# Patient Record
Sex: Female | Born: 2000 | Hispanic: No | Smoking: Never smoker
Health system: Southern US, Community
[De-identification: ages and names within clinical notes are randomized; demographics above are authoritative.]

## PROBLEM LIST (undated history)

## (undated) ENCOUNTER — Inpatient Hospital Stay (HOSPITAL_COMMUNITY): Payer: Medicaid Other

## (undated) DIAGNOSIS — Z789 Other specified health status: Secondary | ICD-10-CM

## (undated) DIAGNOSIS — O4400 Placenta previa specified as without hemorrhage, unspecified trimester: Secondary | ICD-10-CM

## (undated) HISTORY — PX: NO PAST SURGERIES: SHX2092

---

## 2019-01-26 ENCOUNTER — Other Ambulatory Visit: Payer: Self-pay | Admitting: *Deleted

## 2019-01-26 DIAGNOSIS — Z20822 Contact with and (suspected) exposure to covid-19: Secondary | ICD-10-CM

## 2019-02-01 LAB — NOVEL CORONAVIRUS, NAA: SARS-CoV-2, NAA: NOT DETECTED

## 2020-09-01 DIAGNOSIS — Z20822 Contact with and (suspected) exposure to covid-19: Secondary | ICD-10-CM | POA: Diagnosis not present

## 2020-11-14 ENCOUNTER — Other Ambulatory Visit: Payer: Self-pay

## 2020-11-14 ENCOUNTER — Encounter (HOSPITAL_COMMUNITY): Payer: Self-pay

## 2020-11-14 ENCOUNTER — Inpatient Hospital Stay (HOSPITAL_COMMUNITY)
Admission: AD | Admit: 2020-11-14 | Discharge: 2020-11-15 | Disposition: A | Discharge status: Home / Self Care | Payer: Medicaid Other | Attending: Family Medicine | Admitting: Family Medicine

## 2020-11-14 DIAGNOSIS — O208 Other hemorrhage in early pregnancy: Secondary | ICD-10-CM | POA: Insufficient documentation

## 2020-11-14 DIAGNOSIS — O209 Hemorrhage in early pregnancy, unspecified: Secondary | ICD-10-CM

## 2020-11-14 DIAGNOSIS — Z3A08 8 weeks gestation of pregnancy: Secondary | ICD-10-CM | POA: Diagnosis not present

## 2020-11-14 DIAGNOSIS — O3680X0 Pregnancy with inconclusive fetal viability, not applicable or unspecified: Secondary | ICD-10-CM

## 2020-11-14 DIAGNOSIS — O4691 Antepartum hemorrhage, unspecified, first trimester: Secondary | ICD-10-CM | POA: Diagnosis not present

## 2020-11-14 DIAGNOSIS — Z3A09 9 weeks gestation of pregnancy: Secondary | ICD-10-CM | POA: Insufficient documentation

## 2020-11-14 DIAGNOSIS — O468X1 Other antepartum hemorrhage, first trimester: Secondary | ICD-10-CM

## 2020-11-14 HISTORY — DX: Other specified health status: Z78.9

## 2020-11-14 NOTE — ED Triage Notes (Signed)
Pt states she has had a positive pregnancy test at home, LMP was 09/11/20.  Pt states she has had vaginal bleeding today. Denies pain, nausea, vomiting, or difficulty w/urination. This is first pregnancy.

## 2020-11-14 NOTE — ED Notes (Signed)
Report given to MAU, transport called.

## 2020-11-14 NOTE — ED Triage Notes (Signed)
Emergency Medicine Provider Triage Evaluation Note  Tanya Myers , a 20 y.o. female  was evaluated in triage.  Pt complains of vaginal bleeding began PTA.  Only gone through 1 pad.   Review of Systems  Positive: Vaginal bleeding Negative: Fever, chills, abdominal or pelvic pain   Physical Exam  BP 127/64   Pulse 79   Temp 99 F (37.2 C) (Oral)   Resp 16   SpO2 100%  Gen:   Awake, no distress   HEENT:  Atraumatic  Resp:  Normal effort, lungs clear Cardiac:  Normal rate, rhythm  Abd:   Nondistended, nontender  MSK:   Moves extremities without difficulty  Neuro:  Speech clear   Medical Decision Making  Medically screening exam initiated at 11:18 PM.  Appropriate orders placed.  Tanya Myers was informed that the remainder of the evaluation will be completed by another provider, this initial triage assessment does not replace that evaluation, and the importance of remaining in the ED until their evaluation is complete.  Clinical Impression   Appropriate for transfer to MAU. Spoke to MAU MD who has accepted patient. RN notified.    Kinnie Feil, PA-C 11/14/20 2320

## 2020-11-15 ENCOUNTER — Inpatient Hospital Stay (HOSPITAL_COMMUNITY): Payer: Medicaid Other

## 2020-11-15 ENCOUNTER — Encounter (HOSPITAL_COMMUNITY): Payer: Self-pay

## 2020-11-15 DIAGNOSIS — O209 Hemorrhage in early pregnancy, unspecified: Secondary | ICD-10-CM | POA: Diagnosis not present

## 2020-11-15 DIAGNOSIS — Z3A09 9 weeks gestation of pregnancy: Secondary | ICD-10-CM | POA: Diagnosis not present

## 2020-11-15 DIAGNOSIS — O4691 Antepartum hemorrhage, unspecified, first trimester: Secondary | ICD-10-CM | POA: Diagnosis not present

## 2020-11-15 DIAGNOSIS — Z3A08 8 weeks gestation of pregnancy: Secondary | ICD-10-CM | POA: Diagnosis not present

## 2020-11-15 DIAGNOSIS — O208 Other hemorrhage in early pregnancy: Secondary | ICD-10-CM | POA: Diagnosis not present

## 2020-11-15 LAB — GC/CHLAMYDIA PROBE AMP (~~LOC~~) NOT AT ARMC
Chlamydia: NEGATIVE
Comment: NEGATIVE
Comment: NORMAL
Neisseria Gonorrhea: NEGATIVE

## 2020-11-15 LAB — BASIC METABOLIC PANEL
Anion gap: 10 (ref 5–15)
BUN: 9 mg/dL (ref 6–20)
CO2: 20 mmol/L — ABNORMAL LOW (ref 22–32)
Calcium: 9.6 mg/dL (ref 8.9–10.3)
Chloride: 104 mmol/L (ref 98–111)
Creatinine, Ser: 0.62 mg/dL (ref 0.44–1.00)
GFR, Estimated: >60 mL/min (ref >60)
Glucose, Bld: 88 mg/dL (ref 70–99)
Potassium: 3.5 mmol/L (ref 3.5–5.1)
Sodium: 134 mmol/L — ABNORMAL LOW (ref 135–145)

## 2020-11-15 LAB — CBC
HCT: 35.8 % — ABNORMAL LOW (ref 36.0–46.0)
Hemoglobin: 11.5 g/dL — ABNORMAL LOW (ref 12.0–15.0)
MCH: 25.3 pg — ABNORMAL LOW (ref 26.0–34.0)
MCHC: 32.1 g/dL (ref 30.0–36.0)
MCV: 78.9 fL — ABNORMAL LOW (ref 80.0–100.0)
Platelets: 370 10*3/uL (ref 150–400)
RBC: 4.54 MIL/uL (ref 3.87–5.11)
RDW: 15.8 % — ABNORMAL HIGH (ref 11.5–15.5)
WBC: 11.4 10*3/uL — ABNORMAL HIGH (ref 4.0–10.5)
nRBC: 0 % (ref 0.0–0.2)

## 2020-11-15 LAB — WET PREP, GENITAL
Sperm: NONE SEEN
Trich, Wet Prep: NONE SEEN
WBC, Wet Prep HPF POC: NONE SEEN
Yeast Wet Prep HPF POC: NONE SEEN

## 2020-11-15 LAB — POCT PREGNANCY, URINE: Preg Test, Ur: POSITIVE — AB

## 2020-11-15 NOTE — MAU Provider Note (Signed)
Chief Complaint: Vaginal Bleeding   Event Date/Time   First Provider Initiated Contact with Patient 11/15/20 0120        SUBJECTIVE HPI: Tanya Myers is a 20 y.o. G1P0 at [redacted]w[redacted]d by LMP who presents to maternity admissions reporting vaginal bleeding which started yesterday.  Was heavy at first and now is light.  No pain.   Plans care with [facility]. . She denies vaginal bleeding, vaginal itching/burning, urinary symptoms, h/a, dizziness, n/v, or fever/chills.    Vaginal Bleeding The patient's primary symptoms include vaginal bleeding. The patient's pertinent negatives include no genital itching, genital lesions, genital odor or pelvic pain. This is a new problem. The current episode started yesterday. The problem occurs intermittently. The patient is experiencing no pain. She is pregnant. Pertinent negatives include no abdominal pain, back pain, chills, fever, frequency, headaches, nausea or vomiting. The vaginal discharge was bloody. The vaginal bleeding is lighter than menses. She has not been passing clots. She has not been passing tissue. Nothing aggravates the symptoms. She has tried nothing for the symptoms.   RN Note: Patient had a positive home pregnancy test about 3 weeks ago. Began bleeding yesterday around 10pm when she was on the toilet. Denies abdominal cramping.  LMP: 09/11/2020  Past Medical History:  Diagnosis Date  . Medical history non-contributory    Past Surgical History:  Procedure Laterality Date  . NO PAST SURGERIES     Social History   Socioeconomic History  . Marital status: Soil scientist    Spouse name: Not on file  . Number of children: Not on file  . Years of education: Not on file  . Highest education level: Not on file  Occupational History  . Not on file  Tobacco Use  . Smoking status: Never Smoker  . Smokeless tobacco: Never Used  Vaping Use  . Vaping Use: Never used  Substance and Sexual Activity  . Alcohol use: Never  . Drug use: Never  . Sexual  activity: Not on file  Other Topics Concern  . Not on file  Social History Narrative  . Not on file   Social Determinants of Health   Financial Resource Strain: Not on file  Food Insecurity: Not on file  Transportation Needs: Not on file  Physical Activity: Not on file  Stress: Not on file  Social Connections: Not on file  Intimate Partner Violence: Not on file   No current facility-administered medications on file prior to encounter.   No current outpatient medications on file prior to encounter.   No Known Allergies  I have reviewed patient's Past Medical Hx, Surgical Hx, Family Hx, Social Hx, medications and allergies.   ROS:  Review of Systems  Constitutional: Negative for chills and fever.  Gastrointestinal: Negative for abdominal pain, nausea and vomiting.  Genitourinary: Positive for vaginal bleeding. Negative for frequency and pelvic pain.  Musculoskeletal: Negative for back pain.  Neurological: Negative for headaches.   Review of Systems  Other systems negative   Physical Exam  Physical Exam Patient Vitals for the past 24 hrs:  BP Temp Temp src Pulse Resp SpO2 Height Weight  11/15/20 0015 (!) 105/45 -- -- 75 16 100 % 5\' 4"  (1.626 m) 83.8 kg  11/14/20 2312 127/64 99 F (37.2 C) Oral 79 16 100 % -- --   Constitutional: Well-developed, well-nourished female in no acute distress.  Cardiovascular: normal rate Respiratory: normal effort GI: Abd soft, non-tender. No guarding or rebound MS: Extremities nontender, no edema, normal ROM Neurologic: Alert  and oriented x 4.  GU: Neg CVAT.  PELVIC EXAM: Cervix pink, visually closed, without lesion, scant pink discharge, vaginal walls and external genitalia normal   LAB RESULTS Results for orders placed or performed during the hospital encounter of 11/14/20 (from the past 24 hour(s))  CBC     Status: Abnormal   Collection Time: 11/15/20 12:25 AM  Result Value Ref Range   WBC 11.4 (H) 4.0 - 10.5 K/uL   RBC 4.54  3.87 - 5.11 MIL/uL   Hemoglobin 11.5 (L) 12.0 - 15.0 g/dL   HCT 35.8 (L) 36.0 - 46.0 %   MCV 78.9 (L) 80.0 - 100.0 fL   MCH 25.3 (L) 26.0 - 34.0 pg   MCHC 32.1 30.0 - 36.0 g/dL   RDW 15.8 (H) 11.5 - 15.5 %   Platelets 370 150 - 400 K/uL   nRBC 0.0 0.0 - 0.2 %  Basic metabolic panel     Status: Abnormal   Collection Time: 11/15/20 12:25 AM  Result Value Ref Range   Sodium 134 (L) 135 - 145 mmol/L   Potassium 3.5 3.5 - 5.1 mmol/L   Chloride 104 98 - 111 mmol/L   CO2 20 (L) 22 - 32 mmol/L   Glucose, Bld 88 70 - 99 mg/dL   BUN 9 6 - 20 mg/dL   Creatinine, Ser 0.62 0.44 - 1.00 mg/dL   Calcium 9.6 8.9 - 10.3 mg/dL   GFR, Estimated >60 >60 mL/min   Anion gap 10 5 - 15  Pregnancy, urine POC     Status: Abnormal   Collection Time: 11/15/20 12:27 AM  Result Value Ref Range   Preg Test, Ur POSITIVE (A) NEGATIVE  Wet prep, genital     Status: Abnormal   Collection Time: 11/15/20  1:28 AM   Specimen: Vaginal  Result Value Ref Range   Yeast Wet Prep HPF POC NONE SEEN NONE SEEN   Trich, Wet Prep NONE SEEN NONE SEEN   Clue Cells Wet Prep HPF POC PRESENT (A) NONE SEEN   WBC, Wet Prep HPF POC NONE SEEN NONE SEEN   Sperm NONE SEEN     IMAGING US OB Comp Less 14 Wks  Result Date: 11/15/2020 CLINICAL DATA:  Vaginal bleeding today. Estimated gestational age by LMP is 9 weeks 2 days. Quantitative beta HCG is not provided. EXAM: OBSTETRIC <14 WK [place] AND TRANSVAGINAL OB US TECHNIQUE: Both transabdominal and transvaginal ultrasound examinations were performed for complete evaluation of the gestation as well as the maternal uterus, adnexal regions, and pelvic cul-de-sac. Transvaginal technique was performed to assess early pregnancy. COMPARISON:  None. FINDINGS: Intrauterine gestational sac: A single intrauterine gestational sac is identified. Yolk sac:  The yolk sac is not visualized. Embryo:  Fetal pole is identified. Cardiac Activity: Fetal cardiac activity is observed. Heart Rate: 174 bpm CRL: 24.1  mm   9 w   1 d                  [place] EDC: 06/19/2021 Subchorionic hemorrhage: A small subchorionic hemorrhage is identified inferiorly, measuring 2.9 x 1.1 cm. Maternal uterus/adnexae: Uterus appears retroverted. No myometrial mass lesions. Small nabothian cysts in the cervix. Ovaries are not visualized but no abnormal adnexal masses are seen. No abnormal pelvic fluid collections. IMPRESSION: Single intrauterine pregnancy. Estimated gestational age by crown-rump length is 9 weeks 1 day. Small subchorionic hemorrhage. Electronically Signed   By: Lucienne Capers M.D.   On: 11/15/2020 02:18   US OB Transvaginal  Result  Date: 11/15/2020 CLINICAL DATA:  Vaginal bleeding today. Estimated gestational age by LMP is 9 weeks 2 days. Quantitative beta HCG is not provided. EXAM: OBSTETRIC <14 WK [place] AND TRANSVAGINAL OB US TECHNIQUE: Both transabdominal and transvaginal ultrasound examinations were performed for complete evaluation of the gestation as well as the maternal uterus, adnexal regions, and pelvic cul-de-sac. Transvaginal technique was performed to assess early pregnancy. COMPARISON:  None. FINDINGS: Intrauterine gestational sac: A single intrauterine gestational sac is identified. Yolk sac:  The yolk sac is not visualized. Embryo:  Fetal pole is identified. Cardiac Activity: Fetal cardiac activity is observed. Heart Rate: 174 bpm CRL: 24.1 mm   9 w   1 d                  [place] EDC: 06/19/2021 Subchorionic hemorrhage: A small subchorionic hemorrhage is identified inferiorly, measuring 2.9 x 1.1 cm. Maternal uterus/adnexae: Uterus appears retroverted. No myometrial mass lesions. Small nabothian cysts in the cervix. Ovaries are not visualized but no abnormal adnexal masses are seen. No abnormal pelvic fluid collections. IMPRESSION: Single intrauterine pregnancy. Estimated gestational age by crown-rump length is 9 weeks 1 day. Small subchorionic hemorrhage. Electronically Signed   By: Lucienne Capers M.D.   On: 11/15/2020  02:18   MAU Management/MDM: Ordered usual first trimester r/o ectopic labs.   Pelvic exam and cultures done Will check baseline Ultrasound to rule out ectopic.  This bleeding can represent a normal pregnancy with bleeding, spontaneous abortion or even an ectopic which can be life-threatening.  The process as listed above helps to determine which of these is present.  Reviewed findings with patient.  Discussed small subchorionic hemorrhage.  My have spotting for week or so until resolves.  Patient has new OB appt scheduled already  ASSESSMENT Single IUP at [redacted]w[redacted]d Bleeding in first trimester Small Subchorionic hemorrhage  PLAN Discharge home Encouraged to start prenatal care Pelvic rest Pt stable at time of discharge. Encouraged to return if she develops worsening of symptoms, increase in pain, fever, or other concerning symptoms.    Hansel Feinstein CNM, MSN Certified Nurse-Midwife 11/15/2020  1:20 AM

## 2020-11-15 NOTE — Discharge Instructions (Signed)
Subchorionic Hematoma  A hematoma is a collection of blood outside of the blood vessels. A subchorionic hematoma is a collection of blood between the outer wall of the embryo (chorion) and the inner wall of the uterus. This condition can cause vaginal bleeding. Early small hematomas usually shrink on their own and do not affect your baby or pregnancy. When bleeding starts later in pregnancy, or if the hematoma is larger or occurs in older pregnant women, the condition may be more serious. Larger hematomas increase the chances of miscarriage. This condition also increases the risk of:  Premature separation of the placenta from the uterus.  Premature (preterm) labor.  Stillbirth. What are the causes? The exact cause of this condition is not known. It occurs when blood is trapped between the placenta and the uterine wall because the placenta has separated from the original site of implantation. What increases the risk? You are more likely to develop this condition if:  You were treated with fertility medicines.  You became pregnant through in vitro fertilization (IVF). What are the signs or symptoms? Symptoms of this condition include:  Vaginal spotting or bleeding.  Abdominal pain. This is rare. Sometimes you may have no symptoms and the bleeding may only be seen when ultrasound images are taken (transvaginal ultrasound). How is this diagnosed? This condition is diagnosed based on a physical exam. This includes a pelvic exam. You may also have other tests, including:  Blood tests.  Urine tests.  Ultrasound of the abdomen. How is this treated? Treatment for this condition can vary. Treatment may include:  Watchful waiting. You will be monitored closely for any changes in bleeding.  Medicines.  Activity restriction. This may be needed until the bleeding stops.  A medicine called Rh immunoglobulin. This is given if you have an Rh-negative blood type. It prevents Rh  sensitization. Follow these instructions at home:  Stay on bed rest if told to do so by your health care provider.  Do not lift anything that is heavier than 10 lb (4.5 kg), or the limit that you are told by your health care provider.  Track and write down the number of pads you use each day and how soaked (saturated) they are.  Do not use tampons.  Keep all follow-up visits. This is important. Your health care provider may ask you to have follow-up blood tests or ultrasound tests or both. Contact a health care provider if:  You have any vaginal bleeding.  You have a fever. Get help right away if:  You have severe cramps in your stomach, back, abdomen, or pelvis.  You pass large clots or tissue. Save any tissue for your health care provider to look at.  You faint.  You become light-headed or weak. Summary  A subchorionic hematoma is a collection of blood between the outer wall of the embryo (chorion) and the inner wall of the uterus.  This condition can cause vaginal bleeding.  Sometimes you may have no symptoms and the bleeding may only be seen when ultrasound images are taken.  Treatment may include watchful waiting, medicines, or activity restriction.  Keep all follow-up visits. Get help right away if you have severe cramps or heavy vaginal bleeding. This information is not intended to replace advice given to you by your health care provider. Make sure you discuss any questions you have with your health care provider. Document Revised: 04/03/2020 Document Reviewed: 04/03/2020 Elsevier Patient Education  2021 [employer].

## 2020-11-15 NOTE — MAU Note (Signed)
Patient had a positive home pregnancy test about 3 weeks ago. Began bleeding yesterday around 10pm when she was on the toilet. Denies abdominal cramping.  LMP: 09/11/2020

## 2020-12-12 DIAGNOSIS — N925 Other specified irregular menstruation: Secondary | ICD-10-CM | POA: Diagnosis not present

## 2020-12-12 DIAGNOSIS — N912 Amenorrhea, unspecified: Secondary | ICD-10-CM | POA: Diagnosis not present

## 2020-12-12 DIAGNOSIS — Z113 Encounter for screening for infections with a predominantly sexual mode of transmission: Secondary | ICD-10-CM | POA: Diagnosis not present

## 2020-12-12 DIAGNOSIS — O3680X9 Pregnancy with inconclusive fetal viability, other fetus: Secondary | ICD-10-CM | POA: Diagnosis not present

## 2020-12-12 DIAGNOSIS — Z3490 Encounter for supervision of normal pregnancy, unspecified, unspecified trimester: Secondary | ICD-10-CM | POA: Diagnosis not present

## 2020-12-12 DIAGNOSIS — Z3A13 13 weeks gestation of pregnancy: Secondary | ICD-10-CM | POA: Diagnosis not present

## 2020-12-12 LAB — HEPATITIS C ANTIBODY: HCV Ab: NEGATIVE

## 2020-12-12 LAB — OB RESULTS CONSOLE RUBELLA ANTIBODY, IGM: Rubella: IMMUNE

## 2021-02-07 DIAGNOSIS — Z3402 Encounter for supervision of normal first pregnancy, second trimester: Secondary | ICD-10-CM | POA: Diagnosis not present

## 2021-02-07 DIAGNOSIS — Z363 Encounter for antenatal screening for malformations: Secondary | ICD-10-CM | POA: Diagnosis not present

## 2021-02-07 DIAGNOSIS — O365999 Maternal care for other known or suspected poor fetal growth, unspecified trimester, other fetus: Secondary | ICD-10-CM | POA: Diagnosis not present

## 2021-02-07 DIAGNOSIS — Z3A21 21 weeks gestation of pregnancy: Secondary | ICD-10-CM | POA: Diagnosis not present

## 2021-02-07 DIAGNOSIS — Z3686 Encounter for antenatal screening for cervical length: Secondary | ICD-10-CM | POA: Diagnosis not present

## 2021-03-08 DIAGNOSIS — Z3A25 25 weeks gestation of pregnancy: Secondary | ICD-10-CM | POA: Diagnosis not present

## 2021-03-08 DIAGNOSIS — Z362 Encounter for other antenatal screening follow-up: Secondary | ICD-10-CM | POA: Diagnosis not present

## 2021-03-08 DIAGNOSIS — Z331 Pregnant state, incidental: Secondary | ICD-10-CM | POA: Diagnosis not present

## 2021-03-08 DIAGNOSIS — O4402 Placenta previa specified as without hemorrhage, second trimester: Secondary | ICD-10-CM | POA: Diagnosis not present

## 2021-03-08 DIAGNOSIS — E559 Vitamin D deficiency, unspecified: Secondary | ICD-10-CM | POA: Diagnosis not present

## 2021-03-08 DIAGNOSIS — Z3490 Encounter for supervision of normal pregnancy, unspecified, unspecified trimester: Secondary | ICD-10-CM | POA: Diagnosis not present

## 2021-03-21 DIAGNOSIS — Z3492 Encounter for supervision of normal pregnancy, unspecified, second trimester: Secondary | ICD-10-CM | POA: Diagnosis not present

## 2021-03-21 LAB — OB RESULTS CONSOLE HIV ANTIBODY (ROUTINE TESTING): HIV: NONREACTIVE

## 2021-04-06 DIAGNOSIS — Z3686 Encounter for antenatal screening for cervical length: Secondary | ICD-10-CM | POA: Diagnosis not present

## 2021-04-06 DIAGNOSIS — O4403 Placenta previa specified as without hemorrhage, third trimester: Secondary | ICD-10-CM | POA: Diagnosis not present

## 2021-04-06 DIAGNOSIS — Z23 Encounter for immunization: Secondary | ICD-10-CM | POA: Diagnosis not present

## 2021-04-06 DIAGNOSIS — O99019 Anemia complicating pregnancy, unspecified trimester: Secondary | ICD-10-CM | POA: Diagnosis not present

## 2021-04-06 DIAGNOSIS — O365999 Maternal care for other known or suspected poor fetal growth, unspecified trimester, other fetus: Secondary | ICD-10-CM | POA: Diagnosis not present

## 2021-04-06 DIAGNOSIS — E559 Vitamin D deficiency, unspecified: Secondary | ICD-10-CM | POA: Diagnosis not present

## 2021-04-06 DIAGNOSIS — Z3A29 29 weeks gestation of pregnancy: Secondary | ICD-10-CM | POA: Diagnosis not present

## 2021-04-06 DIAGNOSIS — Z3493 Encounter for supervision of normal pregnancy, unspecified, third trimester: Secondary | ICD-10-CM | POA: Diagnosis not present

## 2021-04-06 DIAGNOSIS — Z331 Pregnant state, incidental: Secondary | ICD-10-CM | POA: Diagnosis not present

## 2021-05-04 DIAGNOSIS — O365999 Maternal care for other known or suspected poor fetal growth, unspecified trimester, other fetus: Secondary | ICD-10-CM | POA: Diagnosis not present

## 2021-05-04 DIAGNOSIS — O4403 Placenta previa specified as without hemorrhage, third trimester: Secondary | ICD-10-CM | POA: Diagnosis not present

## 2021-05-04 DIAGNOSIS — Z3A33 33 weeks gestation of pregnancy: Secondary | ICD-10-CM | POA: Diagnosis not present

## 2021-05-18 DIAGNOSIS — Z3402 Encounter for supervision of normal first pregnancy, second trimester: Secondary | ICD-10-CM | POA: Diagnosis not present

## 2021-05-22 ENCOUNTER — Other Ambulatory Visit: Payer: Self-pay | Admitting: Obstetrics and Gynecology

## 2021-05-23 DIAGNOSIS — Z3A36 36 weeks gestation of pregnancy: Secondary | ICD-10-CM | POA: Diagnosis not present

## 2021-05-23 DIAGNOSIS — O99019 Anemia complicating pregnancy, unspecified trimester: Secondary | ICD-10-CM | POA: Diagnosis not present

## 2021-05-23 DIAGNOSIS — Z331 Pregnant state, incidental: Secondary | ICD-10-CM | POA: Diagnosis not present

## 2021-05-23 DIAGNOSIS — E559 Vitamin D deficiency, unspecified: Secondary | ICD-10-CM | POA: Diagnosis not present

## 2021-05-23 DIAGNOSIS — O365999 Maternal care for other known or suspected poor fetal growth, unspecified trimester, other fetus: Secondary | ICD-10-CM | POA: Diagnosis not present

## 2021-05-23 DIAGNOSIS — O4403 Placenta previa specified as without hemorrhage, third trimester: Secondary | ICD-10-CM | POA: Diagnosis not present

## 2021-05-23 NOTE — Patient Instructions (Signed)
Rennee Coyne  05/23/2021   Your procedure is scheduled on:  05/29/2021  Arrive at 1:15 PM at Entrance C on [address] at [facility]  and [address]. You are invited to use the FREE valet parking or use the Visitor's parking deck.  Pick up the phone at the desk and dial [phone].  Call this number if you have problems the morning of surgery: [phone]  Remember:   Do not eat food:(After Midnight) Desps de medianoche.  Do not drink clear liquids: (After Midnight) Desps de medianoche.  Take these medicines the morning of surgery with A SIP OF WATER:  none   Do not wear jewelry, make-up or nail polish.  Do not wear lotions, powders, or perfumes. Do not wear deodorant.  Do not shave 48 hours prior to surgery.  Do not bring valuables to the hospital.  [facility] is not   responsible for any belongings or valuables brought to the hospital.  Contacts, dentures or bridgework may not be worn into surgery.  Leave suitcase in the car. After surgery it may be brought to your room.  For patients admitted to the hospital, checkout time is 11:00 AM the day of              discharge.      Please read over the following fact sheets that you were given:     Preparing for Surgery

## 2021-05-24 ENCOUNTER — Telehealth (HOSPITAL_COMMUNITY): Payer: Self-pay | Admitting: *Deleted

## 2021-05-24 ENCOUNTER — Encounter (HOSPITAL_COMMUNITY): Payer: Self-pay

## 2021-05-24 NOTE — Telephone Encounter (Signed)
Preadmission screen  

## 2021-05-28 ENCOUNTER — Other Ambulatory Visit: Payer: Self-pay

## 2021-05-28 ENCOUNTER — Encounter (HOSPITAL_COMMUNITY)
Admission: RE | Admit: 2021-05-28 | Discharge: 2021-05-28 | Disposition: A | Discharge status: Home / Self Care | Payer: Medicaid Other | Source: Ambulatory Visit | Attending: Obstetrics & Gynecology | Admitting: Obstetrics & Gynecology

## 2021-05-28 ENCOUNTER — Other Ambulatory Visit: Payer: Self-pay | Admitting: Obstetrics & Gynecology

## 2021-05-28 DIAGNOSIS — Z01812 Encounter for preprocedural laboratory examination: Secondary | ICD-10-CM | POA: Insufficient documentation

## 2021-05-28 DIAGNOSIS — Z20822 Contact with and (suspected) exposure to covid-19: Secondary | ICD-10-CM | POA: Insufficient documentation

## 2021-05-28 HISTORY — DX: Complete placenta previa nos or without hemorrhage, unspecified trimester: O44.00

## 2021-05-28 LAB — CBC
HCT: 40.1 % (ref 36.0–46.0)
Hemoglobin: 13.4 g/dL (ref 12.0–15.0)
MCH: 28.6 pg (ref 26.0–34.0)
MCHC: 33.4 g/dL (ref 30.0–36.0)
MCV: 85.7 fL (ref 80.0–100.0)
Platelets: 286 10*3/uL (ref 150–400)
RBC: 4.68 MIL/uL (ref 3.87–5.11)
RDW: 20.3 % — ABNORMAL HIGH (ref 11.5–15.5)
WBC: 10.6 10*3/uL — ABNORMAL HIGH (ref 4.0–10.5)
nRBC: 0 % (ref 0.0–0.2)

## 2021-05-28 LAB — SARS CORONAVIRUS 2 (TAT 6-24 HRS): SARS Coronavirus 2: NEGATIVE

## 2021-05-28 LAB — TYPE AND SCREEN
ABO/RH(D): A POS
Antibody Screen: NEGATIVE

## 2021-05-28 NOTE — Anesthesia Preprocedure Evaluation (Addendum)
Anesthesia Evaluation  Patient identified by MRN, date of birth, ID band Patient awake    Reviewed: Allergy & Precautions, NPO status , Patient's Chart, lab work & pertinent test results  History of Anesthesia Complications Negative for: history of anesthetic complications  Airway Mallampati: II  TM Distance: >3 FB Neck ROM: Full    Dental no notable dental hx. (+) Dental Advisory Given Braces:   Pulmonary neg pulmonary ROS,    Pulmonary exam normal        Cardiovascular negative cardio ROS Normal cardiovascular exam     Neuro/Psych negative neurological ROS     GI/Hepatic negative GI ROS, Neg liver ROS,   Endo/Other  negative endocrine ROS  Renal/GU negative Renal ROS     Musculoskeletal negative musculoskeletal ROS (+)   Abdominal   Peds  Hematology negative hematology ROS (+)   Anesthesia Other Findings   Reproductive/Obstetrics (+) Pregnancy                            Anesthesia Physical Anesthesia Plan  ASA: 2  Anesthesia Plan: Spinal   Post-op Pain Management:    Induction:   PONV Risk Score and Plan: 3 and Ondansetron, Dexamethasone and Scopolamine patch - Pre-op  Airway Management Planned: Natural Airway  Additional Equipment:   Intra-op Plan:   Post-operative Plan:   Informed Consent: I have reviewed the patients History and Physical, chart, labs and discussed the procedure including the risks, benefits and alternatives for the proposed anesthesia with the patient or authorized representative who has indicated his/her understanding and acceptance.     Dental advisory given  Plan Discussed with: Anesthesiologist and CRNA  Anesthesia Plan Comments:        Anesthesia Quick Evaluation

## 2021-05-29 ENCOUNTER — Inpatient Hospital Stay (HOSPITAL_COMMUNITY): Payer: Medicaid Other | Admitting: Anesthesiology

## 2021-05-29 ENCOUNTER — Inpatient Hospital Stay (HOSPITAL_COMMUNITY)
Admission: RE | Admit: 2021-05-29 | Discharge: 2021-06-01 | DRG: 787 | Disposition: A | Discharge status: Home / Self Care | Payer: Medicaid Other | Attending: Obstetrics & Gynecology | Admitting: Obstetrics & Gynecology

## 2021-05-29 ENCOUNTER — Encounter (HOSPITAL_COMMUNITY): Payer: Self-pay | Admitting: Obstetrics & Gynecology

## 2021-05-29 ENCOUNTER — Other Ambulatory Visit: Payer: Self-pay

## 2021-05-29 ENCOUNTER — Encounter (HOSPITAL_COMMUNITY)
Admission: RE | Disposition: A | Discharge status: Home / Self Care | Payer: Self-pay | Source: Home / Self Care | Attending: Obstetrics & Gynecology

## 2021-05-29 DIAGNOSIS — O34211 Maternal care for low transverse scar from previous cesarean delivery: Secondary | ICD-10-CM | POA: Diagnosis not present

## 2021-05-29 DIAGNOSIS — O43813 Placental infarction, third trimester: Secondary | ICD-10-CM | POA: Diagnosis not present

## 2021-05-29 DIAGNOSIS — O3413 Maternal care for benign tumor of corpus uteri, third trimester: Secondary | ICD-10-CM | POA: Diagnosis not present

## 2021-05-29 DIAGNOSIS — Z3A37 37 weeks gestation of pregnancy: Secondary | ICD-10-CM | POA: Diagnosis not present

## 2021-05-29 DIAGNOSIS — Z98891 History of uterine scar from previous surgery: Secondary | ICD-10-CM

## 2021-05-29 DIAGNOSIS — O322XX0 Maternal care for transverse and oblique lie, not applicable or unspecified: Secondary | ICD-10-CM | POA: Diagnosis not present

## 2021-05-29 DIAGNOSIS — D259 Leiomyoma of uterus, unspecified: Secondary | ICD-10-CM | POA: Diagnosis not present

## 2021-05-29 DIAGNOSIS — O4400 Placenta previa specified as without hemorrhage, unspecified trimester: Secondary | ICD-10-CM | POA: Diagnosis present

## 2021-05-29 DIAGNOSIS — D267 Other benign neoplasm of other parts of uterus: Secondary | ICD-10-CM | POA: Diagnosis not present

## 2021-05-29 DIAGNOSIS — O365930 Maternal care for other known or suspected poor fetal growth, third trimester, not applicable or unspecified: Secondary | ICD-10-CM | POA: Diagnosis not present

## 2021-05-29 DIAGNOSIS — O4403 Placenta previa specified as without hemorrhage, third trimester: Secondary | ICD-10-CM | POA: Diagnosis not present

## 2021-05-29 LAB — RPR: RPR Ser Ql: NONREACTIVE

## 2021-05-29 SURGERY — Surgical Case
Anesthesia: Spinal

## 2021-05-29 MED ORDER — IBUPROFEN 600 MG PO TABS
600.0000 mg | ORAL_TABLET | Freq: Four times a day (QID) | ORAL | Status: DC
  Administered 2021-05-30 – 2021-06-01 (×7): 600 mg via ORAL
  Filled 2021-05-29 (×8): qty 1

## 2021-05-29 MED ORDER — PHENYLEPHRINE HCL-NACL 20-0.9 MG/250ML-% IV SOLN
INTRAVENOUS | Status: DC | PRN
  Administered 2021-05-29: 60 ug/min via INTRAVENOUS

## 2021-05-29 MED ORDER — CEFAZOLIN SODIUM-DEXTROSE 2-4 GM/100ML-% IV SOLN
2.0000 g | INTRAVENOUS | Status: AC
  Administered 2021-05-29: 2 g via INTRAVENOUS

## 2021-05-29 MED ORDER — CELECOXIB 200 MG PO CAPS
ORAL_CAPSULE | ORAL | Status: AC
  Filled 2021-05-29: qty 1

## 2021-05-29 MED ORDER — NALBUPHINE HCL 10 MG/ML IJ SOLN: 5.0000 mg | Freq: Once | INTRAMUSCULAR | Status: DC | PRN

## 2021-05-29 MED ORDER — PRENATAL MULTIVITAMIN CH
1.0000 | ORAL_TABLET | Freq: Every day | ORAL | Status: DC
  Administered 2021-05-30 – 2021-06-01 (×3): 1 via ORAL
  Filled 2021-05-29 (×3): qty 1

## 2021-05-29 MED ORDER — ACETAMINOPHEN 500 MG PO TABS
1000.0000 mg | ORAL_TABLET | Freq: Once | ORAL | Status: AC
  Administered 2021-05-29: 1000 mg via ORAL

## 2021-05-29 MED ORDER — ONDANSETRON HCL 4 MG/2ML IJ SOLN
INTRAMUSCULAR | Status: AC
  Filled 2021-05-29: qty 2

## 2021-05-29 MED ORDER — FENTANYL CITRATE (PF) 100 MCG/2ML IJ SOLN
INTRAMUSCULAR | Status: DC | PRN
  Administered 2021-05-29: 15 ug via INTRATHECAL

## 2021-05-29 MED ORDER — FENTANYL CITRATE (PF) 100 MCG/2ML IJ SOLN: 25.0000 ug | INTRAMUSCULAR | Status: DC | PRN

## 2021-05-29 MED ORDER — ACETAMINOPHEN 500 MG PO TABS
1000.0000 mg | ORAL_TABLET | Freq: Four times a day (QID) | ORAL | Status: DC
  Administered 2021-05-29 – 2021-06-01 (×9): 1000 mg via ORAL
  Filled 2021-05-29 (×10): qty 2

## 2021-05-29 MED ORDER — MEPERIDINE HCL 25 MG/ML IJ SOLN: 6.2500 mg | INTRAMUSCULAR | Status: DC | PRN

## 2021-05-29 MED ORDER — SIMETHICONE 80 MG PO CHEW
80.0000 mg | CHEWABLE_TABLET | Freq: Three times a day (TID) | ORAL | Status: DC
  Administered 2021-05-30 – 2021-06-01 (×7): 80 mg via ORAL
  Filled 2021-05-29 (×7): qty 1

## 2021-05-29 MED ORDER — DIPHENHYDRAMINE HCL 50 MG/ML IJ SOLN: 12.5000 mg | INTRAMUSCULAR | Status: DC | PRN

## 2021-05-29 MED ORDER — DIPHENHYDRAMINE HCL 25 MG PO CAPS: 25.0000 mg | ORAL_CAPSULE | Freq: Four times a day (QID) | ORAL | Status: DC | PRN

## 2021-05-29 MED ORDER — NALOXONE HCL 0.4 MG/ML IJ SOLN: 0.4000 mg | INTRAMUSCULAR | Status: DC | PRN

## 2021-05-29 MED ORDER — OXYTOCIN-SODIUM CHLORIDE 30-0.9 UT/500ML-% IV SOLN
INTRAVENOUS | Status: AC
  Filled 2021-05-29: qty 500

## 2021-05-29 MED ORDER — OXYTOCIN-SODIUM CHLORIDE 30-0.9 UT/500ML-% IV SOLN
INTRAVENOUS | Status: DC | PRN
  Administered 2021-05-29: 250 mL/h via INTRAVENOUS

## 2021-05-29 MED ORDER — FENTANYL CITRATE (PF) 100 MCG/2ML IJ SOLN
INTRAMUSCULAR | Status: AC
  Filled 2021-05-29: qty 2

## 2021-05-29 MED ORDER — WITCH HAZEL-GLYCERIN EX PADS: 1.0000 "application " | MEDICATED_PAD | CUTANEOUS | Status: DC | PRN

## 2021-05-29 MED ORDER — NALBUPHINE HCL 10 MG/ML IJ SOLN: 5.0000 mg | INTRAMUSCULAR | Status: DC | PRN

## 2021-05-29 MED ORDER — CELECOXIB 200 MG PO CAPS
200.0000 mg | ORAL_CAPSULE | Freq: Once | ORAL | Status: AC
  Administered 2021-05-29: 200 mg via ORAL

## 2021-05-29 MED ORDER — SODIUM CHLORIDE 0.9 % IR SOLN
Status: DC | PRN
  Administered 2021-05-29: 500 mL

## 2021-05-29 MED ORDER — SCOPOLAMINE 1 MG/3DAYS TD PT72
1.0000 | MEDICATED_PATCH | Freq: Once | TRANSDERMAL | Status: AC
  Administered 2021-05-29: 1.5 mg via TRANSDERMAL

## 2021-05-29 MED ORDER — MORPHINE SULFATE (PF) 0.5 MG/ML IJ SOLN
INTRAMUSCULAR | Status: AC
  Filled 2021-05-29: qty 10

## 2021-05-29 MED ORDER — SODIUM CHLORIDE 0.9% FLUSH: 3.0000 mL | INTRAVENOUS | Status: DC | PRN

## 2021-05-29 MED ORDER — PHENYLEPHRINE HCL-NACL 20-0.9 MG/250ML-% IV SOLN
INTRAVENOUS | Status: AC
  Filled 2021-05-29: qty 250

## 2021-05-29 MED ORDER — MORPHINE SULFATE (PF) 0.5 MG/ML IJ SOLN
INTRAMUSCULAR | Status: DC | PRN
  Administered 2021-05-29: 150 ug via INTRATHECAL

## 2021-05-29 MED ORDER — OXYTOCIN-SODIUM CHLORIDE 30-0.9 UT/500ML-% IV SOLN
2.5000 [IU]/h | INTRAVENOUS | Status: AC
  Administered 2021-05-29: 2.5 [IU]/h via INTRAVENOUS
  Filled 2021-05-29: qty 500

## 2021-05-29 MED ORDER — PROMETHAZINE HCL 25 MG/ML IJ SOLN: 6.2500 mg | INTRAMUSCULAR | Status: DC | PRN

## 2021-05-29 MED ORDER — STERILE WATER FOR IRRIGATION IR SOLN
Status: DC | PRN
  Administered 2021-05-29: 1000 mL

## 2021-05-29 MED ORDER — COCONUT OIL OIL: 1.0000 "application " | TOPICAL_OIL | Status: DC | PRN

## 2021-05-29 MED ORDER — ACETAMINOPHEN 500 MG PO TABS
ORAL_TABLET | ORAL | Status: AC
  Filled 2021-05-29: qty 2

## 2021-05-29 MED ORDER — BUPIVACAINE HCL (PF) 0.5 % IJ SOLN
INTRAMUSCULAR | Status: AC
  Filled 2021-05-29: qty 30

## 2021-05-29 MED ORDER — SENNOSIDES-DOCUSATE SODIUM 8.6-50 MG PO TABS
2.0000 | ORAL_TABLET | ORAL | Status: DC
  Administered 2021-05-29 – 2021-05-31 (×4): 2 via ORAL
  Filled 2021-05-29 (×3): qty 2

## 2021-05-29 MED ORDER — TRANEXAMIC ACID-NACL 1000-0.7 MG/100ML-% IV SOLN
INTRAVENOUS | Status: DC | PRN
  Administered 2021-05-29: 1000 mg via INTRAVENOUS

## 2021-05-29 MED ORDER — FERROUS SULFATE 325 (65 FE) MG PO TABS
325.0000 mg | ORAL_TABLET | Freq: Two times a day (BID) | ORAL | Status: DC
  Administered 2021-05-30 – 2021-05-31 (×4): 325 mg via ORAL
  Filled 2021-05-29 (×4): qty 1

## 2021-05-29 MED ORDER — LACTATED RINGERS IV SOLN: INTRAVENOUS | Status: DC

## 2021-05-29 MED ORDER — ZOLPIDEM TARTRATE 5 MG PO TABS: 5.0000 mg | ORAL_TABLET | Freq: Every evening | ORAL | Status: DC | PRN

## 2021-05-29 MED ORDER — MENTHOL 3 MG MT LOZG: 1.0000 | LOZENGE | OROMUCOSAL | Status: DC | PRN

## 2021-05-29 MED ORDER — TETANUS-DIPHTH-ACELL PERTUSSIS 5-2.5-18.5 LF-MCG/0.5 IM SUSY: 0.5000 mL | PREFILLED_SYRINGE | Freq: Once | INTRAMUSCULAR | Status: DC

## 2021-05-29 MED ORDER — KETOROLAC TROMETHAMINE 30 MG/ML IJ SOLN
30.0000 mg | Freq: Four times a day (QID) | INTRAMUSCULAR | Status: AC
  Administered 2021-05-29 – 2021-05-30 (×3): 30 mg via INTRAVENOUS
  Filled 2021-05-29 (×3): qty 1

## 2021-05-29 MED ORDER — BUPIVACAINE IN DEXTROSE 0.75-8.25 % IT SOLN
INTRATHECAL | Status: DC | PRN
  Administered 2021-05-29: 1.4 mL via INTRATHECAL

## 2021-05-29 MED ORDER — OXYCODONE HCL 5 MG PO TABS: 5.0000 mg | ORAL_TABLET | ORAL | Status: DC | PRN

## 2021-05-29 MED ORDER — NALOXONE HCL 4 MG/10ML IJ SOLN
1.0000 ug/kg/h | INTRAVENOUS | Status: DC | PRN
  Filled 2021-05-29: qty 5

## 2021-05-29 MED ORDER — HYDROMORPHONE HCL 1 MG/ML IJ SOLN: 0.2000 mg | INTRAMUSCULAR | Status: DC | PRN

## 2021-05-29 MED ORDER — CEFAZOLIN SODIUM-DEXTROSE 2-4 GM/100ML-% IV SOLN
INTRAVENOUS | Status: AC
  Filled 2021-05-29: qty 100

## 2021-05-29 MED ORDER — SCOPOLAMINE 1 MG/3DAYS TD PT72
MEDICATED_PATCH | TRANSDERMAL | Status: AC
  Filled 2021-05-29: qty 1

## 2021-05-29 MED ORDER — POVIDONE-IODINE 10 % EX SWAB
2.0000 "application " | Freq: Once | CUTANEOUS | Status: AC
  Administered 2021-05-29: 2 via TOPICAL

## 2021-05-29 MED ORDER — AMISULPRIDE (ANTIEMETIC) 5 MG/2ML IV SOLN
10.0000 mg | Freq: Once | INTRAVENOUS | Status: DC | PRN
  Filled 2021-05-29: qty 4

## 2021-05-29 MED ORDER — BUPIVACAINE HCL 0.5 % IJ SOLN
INTRAMUSCULAR | Status: DC | PRN
  Administered 2021-05-29: 30 mL

## 2021-05-29 MED ORDER — DEXAMETHASONE SODIUM PHOSPHATE 10 MG/ML IJ SOLN
INTRAMUSCULAR | Status: DC | PRN
  Administered 2021-05-29: 10 mg via INTRAVENOUS

## 2021-05-29 MED ORDER — ONDANSETRON HCL 4 MG/2ML IJ SOLN
INTRAMUSCULAR | Status: DC | PRN
  Administered 2021-05-29: 4 mg via INTRAVENOUS

## 2021-05-29 MED ORDER — DIBUCAINE (PERIANAL) 1 % EX OINT: 1.0000 "application " | TOPICAL_OINTMENT | CUTANEOUS | Status: DC | PRN

## 2021-05-29 MED ORDER — DEXAMETHASONE SODIUM PHOSPHATE 10 MG/ML IJ SOLN
INTRAMUSCULAR | Status: AC
  Filled 2021-05-29: qty 1

## 2021-05-29 MED ORDER — SIMETHICONE 80 MG PO CHEW: 80.0000 mg | CHEWABLE_TABLET | ORAL | Status: DC | PRN

## 2021-05-29 MED ORDER — ONDANSETRON HCL 4 MG/2ML IJ SOLN: 4.0000 mg | Freq: Three times a day (TID) | INTRAMUSCULAR | Status: DC | PRN

## 2021-05-29 MED ORDER — DIPHENHYDRAMINE HCL 25 MG PO CAPS: 25.0000 mg | ORAL_CAPSULE | ORAL | Status: DC | PRN

## 2021-05-29 SURGICAL SUPPLY — 38 items
BENZOIN TINCTURE PRP APPL 2/3 (GAUZE/BANDAGES/DRESSINGS) ×2 IMPLANT
CHLORAPREP W/TINT 26ML (MISCELLANEOUS) ×2 IMPLANT
CLAMP CORD UMBIL (MISCELLANEOUS) IMPLANT
CLIP FILSHIE TUBAL LIGA STRL (Clip) IMPLANT
CLOTH BEACON ORANGE TIMEOUT ST (SAFETY) ×2 IMPLANT
DRSG OPSITE POSTOP 4X10 (GAUZE/BANDAGES/DRESSINGS) ×2 IMPLANT
DRSG PAD ABDOMINAL 8X10 ST (GAUZE/BANDAGES/DRESSINGS) ×2 IMPLANT
ELECT REM PT RETURN 9FT ADLT (ELECTROSURGICAL) ×2
ELECTRODE REM PT RTRN 9FT ADLT (ELECTROSURGICAL) ×1 IMPLANT
EXTRACTOR VACUUM KIWI (MISCELLANEOUS) IMPLANT
GAUZE SPONGE 4X4 12PLY STRL (GAUZE/BANDAGES/DRESSINGS) ×4 IMPLANT
GLOVE BIO SURGEON STRL SZ 6.5 (GLOVE) ×2 IMPLANT
GLOVE BIOGEL PI IND STRL 7.0 (GLOVE) ×2 IMPLANT
GLOVE BIOGEL PI INDICATOR 7.0 (GLOVE) ×2
GOWN STRL REUS W/TWL LRG LVL3 (GOWN DISPOSABLE) ×6 IMPLANT
KIT ABG SYR 3ML LUER SLIP (SYRINGE) IMPLANT
NEEDLE HYPO 22GX1.5 SAFETY (NEEDLE) IMPLANT
NEEDLE HYPO 25X5/8 SAFETYGLIDE (NEEDLE) IMPLANT
NS IRRIG 1000ML POUR BTL (IV SOLUTION) ×2 IMPLANT
PACK C SECTION WH (CUSTOM PROCEDURE TRAY) ×2 IMPLANT
PAD OB MATERNITY 4.3X12.25 (PERSONAL CARE ITEMS) ×2 IMPLANT
PENCIL SMOKE EVAC W/HOLSTER (ELECTROSURGICAL) ×2 IMPLANT
RTRCTR C-SECT PINK 25CM LRG (MISCELLANEOUS) ×2 IMPLANT
STRIP CLOSURE SKIN 1/2X4 (GAUZE/BANDAGES/DRESSINGS) ×2 IMPLANT
SUT CHROMIC 2 0 CT 1 (SUTURE) ×4 IMPLANT
SUT MNCRL 0 VIOLET CTX 36 (SUTURE) ×2 IMPLANT
SUT MONOCRYL 0 CTX 36 (SUTURE) ×2
SUT PDS AB 0 CTX 60 (SUTURE) ×2 IMPLANT
SUT PLAIN 2 0 (SUTURE)
SUT PLAIN 2 0 XLH (SUTURE) ×2 IMPLANT
SUT PLAIN ABS 2-0 CT1 27XMFL (SUTURE) IMPLANT
SUT VIC AB 0 CTX 36 (SUTURE) ×2
SUT VIC AB 0 CTX36XBRD ANBCTRL (SUTURE) ×2 IMPLANT
SUT VIC AB 4-0 KS 27 (SUTURE) ×2 IMPLANT
SYR CONTROL 10ML LL (SYRINGE) IMPLANT
TOWEL OR 17X24 6PK STRL BLUE (TOWEL DISPOSABLE) ×2 IMPLANT
TRAY FOLEY W/BAG SLVR 14FR LF (SET/KITS/TRAYS/PACK) ×2 IMPLANT
WATER STERILE IRR 1000ML POUR (IV SOLUTION) ×2 IMPLANT

## 2021-05-29 NOTE — Anesthesia Procedure Notes (Signed)
Spinal  Patient location during procedure: OR Start time: 05/29/2021 2:03 PM End time: 05/29/2021 2:13 PM Reason for block: surgical anesthesia Staffing Performed: anesthesiologist  Anesthesiologist: Duane Boston, MD Preanesthetic Checklist Completed: patient identified, IV checked, risks and benefits discussed, surgical consent, monitors and equipment checked, pre-op evaluation and timeout performed Spinal Block Patient position: sitting Prep: DuraPrep Patient monitoring: cardiac monitor, continuous pulse ox and blood pressure Approach: midline Location: L2-3 Injection technique: single-shot Needle Needle type: Pencan  Needle gauge: 24 G Needle length: 9 cm Assessment Events: CSF return Additional Notes Functioning IV was confirmed and monitors were applied. Sterile prep and drape, including hand hygiene and sterile gloves were used. The patient was positioned and the spine was prepped. The skin was anesthetized with lidocaine.  Free flow of clear CSF was obtained prior to injecting local anesthetic into the CSF.  The spinal needle aspirated freely following injection.  The needle was carefully withdrawn.  The patient tolerated the procedure well.

## 2021-05-29 NOTE — Transfer of Care (Signed)
Immediate Anesthesia Transfer of Care Note  Patient: Tanya Myers  Procedure(s) Performed: CESAREAN SECTION  Patient Location: PACU  Anesthesia Type:Spinal  Level of Consciousness: awake, alert , oriented and patient cooperative  Airway & Oxygen Therapy: Patient Spontanous Breathing  Post-op Assessment: Report given to RN and Post -op Vital signs reviewed and stable  Post vital signs: Reviewed and stable  Last Vitals:  Vitals Value Taken Time  BP    Temp    Pulse 81 05/29/21 1523  Resp 21 05/29/21 1523  SpO2 100 % 05/29/21 1523  Vitals shown include unvalidated device data.  Last Pain:  Vitals:   05/29/21 1221  TempSrc: Oral         Complications: No notable events documented.

## 2021-05-29 NOTE — Lactation Note (Addendum)
This note was copied from a baby's chart. Lactation Consultation Note  Patient Name: Tanya Myers Date: 05/29/2021 Reason for consult: Initial assessment;Mother's request;Primapara;1st time breastfeeding;Early term 37-38.6wks;Infant < 6lbs;Breastfeeding assistance;Other (Comment) (Anemia, vit D Def) Age:20 years  LC fed infant 1 ml of colostrum on spoon. LC helped with latching observing a 8 min feeding. Infant doing a few suck swallows with breast compression.   LPTI guidelines reviewed to reduce calorie loss including keeping total feeding under 30 min.   Plan 1. To feed based on cue 8-12x 24hr period. Mom to latch infant at breast and look for signs of milk transfer.  2. Mom to supplement with EBM 5-10 ml after each feeding at breast with spoon/ pace bottle for larger volume. RN,Beverly Duffy Bruce brought formula first glucose less than 40. [place]ler alerted Mom if pumping volume meets 5-10 ml per feeding she can offer EBM instead for next feeding.  3. DEBP q 3hrs for 76min  4. I and O sheet reviewed.  All questions answered at the end of the visit.   Maternal Data Has patient been taught Hand Expression?: Yes Does the patient have breastfeeding experience prior to this delivery?: No  Feeding Mother's Current Feeding Choice: Breast Milk  LATCH Score Latch: Repeated attempts needed to sustain latch, nipple held in mouth throughout feeding, stimulation needed to elicit sucking reflex.  Audible Swallowing: A few with stimulation  Type of Nipple: Everted at rest and after stimulation  Comfort (Breast/Nipple): Soft / non-tender  Hold (Positioning): Assistance needed to correctly position infant at breast and maintain latch.  LATCH Score: 7   Lactation Tools Discussed/Used Tools: Pump;Flanges Flange Size: 24 Breast pump type: Double-Electric Breast Pump Pump Education: Setup, frequency, and cleaning;Milk Storage Reason for Pumping: increase stimulation Pumping frequency:  every 3 hrs ror 32min  Interventions Interventions: Breast feeding basics reviewed;Support pillows;Education;Assisted with latch;Position options;Pace feeding;Skin to skin;Expressed [employer];Infant Driven Feeding Algorithm education;Hand express;Breast compression;DEBP;Adjust position;Pre-pump if needed  Discharge WIC Program: Yes  Consult Status Consult Status: Follow-up Date: 05/30/21 Follow-up type: In-patient    Tanya Chirico  Myers 05/29/2021, 5:45 PM

## 2021-05-29 NOTE — H&P (Signed)
20 y.o.G1P0 comes in for a primary cesarean section at 37 weeks 1 day with complete placenta previa.  Patient has good fetal movement, no contractions, no leaking of fluid and no bleeding.   Prenatal Care at [facility] complicated by: Vitamin D deficiency SGA Complete Previa   Past Medical History:  Diagnosis Date   Medical history non-contributory    Placenta previa     Past Surgical History:  Procedure Laterality Date   NO PAST SURGERIES      OB History  Gravida Para Term Preterm AB Living  1            SAB IAB Ectopic Multiple Live Births               # Outcome Date GA Lbr Len/2nd Weight Sex Delivery Anes PTL Lv  1 Current             Social History   Socioeconomic History   Marital status: Soil scientist    Spouse name: Not on file   Years of education: Not on file  Occupational History   Not on file  Tobacco Use   Smoking status: Never   Smokeless tobacco: Never  Vaping Use   Vaping Use: Never used  Substance and Sexual Activity   Alcohol use: Never   Drug use: Never   Sexual activity: Not on file    Prenatal Transfer Tool  Maternal Diabetes: No Genetic Screening: Normal Maternal Ultrasounds/Referrals: Other: SGA / previa Fetal Ultrasounds or other Referrals:  Referred to Materal Fetal Medicine  Maternal Substance Abuse:  No Significant Maternal Medications:  None Significant Maternal Lab Results: Group B Strep negative  Physical Exam Vitals:   05/29/21 1221  BP: (!) 142/97  Pulse: 89  Resp: 16  Temp: 98.6 F (37 C)  SpO2: 100%     Lungs/Cor:  NAD Abdomen:  soft, gravid Ex:  no cords, erythema SVE:  cervix not examined FHTs:  130's moderate variability accels no decels  Assessment and Plan: 20 year old G1P0 at 37 weeks 1 day with complete placenta previa On call to OR for primary cesarean section On call to OR Ancef 2 gM IV pre op             The patient was counseled as to indications, alternatives, risks and benefits of planned  procedure.  Risk include, but are not limited to infection, blood clots in the veins and lungs (pulmonary embolus), allergic reactions, hemorrhage requiring possible transfusion, or a cesarean hysterectomy and fetal injury.    The patient understands that with this procedure There is a possibility of injury to the uterus, bowel-requiring a colostomy for repair, bladder requiring prolonged catheterization, ureters requiring stent placement, or other pelvic/abdominal organs, the vessels or nerves.   Possible wound breakdown.  Possible hernia formation at the sites of abdominal surgical wounds.  Possible less than satisfactory healing of scars (unsightly/tender). The patient understands the procedure and its attendant risks, and gives informed consent to proceed.  Consent signed, witnessed and placed into chart The patient was given the opportunity to ask questions, and her concerns were adequately addressed.   Rogelio Seen Patty Leitzke

## 2021-05-29 NOTE — Anesthesia Postprocedure Evaluation (Signed)
Anesthesia Post Note  Patient: Bailyn Spackman  Procedure(s) Performed: CESAREAN SECTION     Patient location during evaluation: PACU Anesthesia Type: Spinal Level of consciousness: awake and alert Pain management: pain level controlled Vital Signs Assessment: post-procedure vital signs reviewed and stable Respiratory status: spontaneous breathing and respiratory function stable Cardiovascular status: blood pressure returned to baseline and stable Postop Assessment: spinal receding Anesthetic complications: no   No notable events documented.  Last Vitals:  Vitals:   05/29/21 1609 05/29/21 1610  BP:    Pulse: 79 69  Resp: 17 17  Temp:    SpO2: 100% 100%    Last Pain:  Vitals:   05/29/21 1600  TempSrc: Oral   Pain Goal:                Epidural/Spinal Function Cutaneous sensation: Tingles (05/29/21 1600), Patient able to flex knees: Yes (05/29/21 1600), Patient able to lift hips off bed: No (05/29/21 1600), Back pain beyond tenderness at insertion site: No (05/29/21 1600), Progressively worsening motor and/or sensory loss: No (05/29/21 1600), Bowel and/or bladder incontinence post epidural: No (05/29/21 1600)  [place]

## 2021-05-29 NOTE — Op Note (Signed)
Cesarean Section Procedure Note  Tanya Myers  DOB:    03/30/2001  MRN:    [id number]  Date of Surgery:  05/29/2021  Indication: 37 week 1 day SIUP scheduled for a primary cesarean section for complete placenta previa.  Pre-operative Diagnosis:  1. 37 week single intrauterine pregnancy     2. Complete placenta previa  Post-operative Diagnosis: same plus transverse back down fetal presentation  Procedure:  Primary cesarean delivery                         Surgeon: Caffie Damme, MD  Assistants: Loma Boston, DO  Anesthesia: Spinal anesthesia  ASA Class: 2  Procedure Details   The patient was counseled about the risks, benefits, complications of the cesarean section. The patient concurred with the proposed plan, giving informed consent.  The site of surgery properly noted/marked. The patient was taken to Operating Room # A, identified as Tanya Myers and the procedure verified as C-Section Delivery. A Time Out was held and the above information confirmed.  After spinal was found to adequate , the patient was placed in the dorsal supine position with a leftward tilt, draped and prepped in the usual sterile manner. A Pfannenstiel skin incision was made with a 10 blade scalpel and the incision carried down through the subcutaneous tissue to the fascia with the Bovie.   The fascia was incised in the midline and the fascial incision was extended laterally with Mayo scissors. The superior aspect of the fascial incision was grasped with Coker clamps x2, tented up and the rectus muscles dissected off sharply with the bovie.  The rectus was then dissected off with blunt dissection and Mayo scissors inferiorly. The rectus muscles were separated in the midline. The abdominal peritoneum was identified, tented up between two hemostats and entered sharply with Metzenbaum scissors. The peritoneal opening was bluntly extended with gentle pulling.   The Alexis retractor was then deployed. The  vesicouterine peritoneum was identified, tented up, entered sharply with Metzenbaum scissors, and the bladder flap was created digitally. A fresh 10 blade scalpel was then used to make a low transverse incision on the uterus which was extended laterally with  blunt dissection. The anterior placenta was obscuring the baby and trying to deliver prematurely. It was noted that the fetus was transverse, back down.  Attempt made to turn the baby to breech, and the fetal buttocks were palpated and delivered gradually through the hysterotomy. A moist towel was placed over the hips of the baby and with gentle fundal pressure, both legs were extended using the pinard maneuver. The legs and body gradually delivered. Once the scapula could be seen the baby was gently rotated and both arms were delivered using the loveseat maneuver. Maintaining head flexion in a modified Mariceau-smellie- veit maneuver the head was delivered with great difficulty. Due to the difficulty delivering the fetal head, assistance was called for and the faculty practice attending, Dr. Nehemiah Settle came immediately to assist. The baby was ultimately delivered from breech presentation.  A live  Female with Apgar scores of 6 at one minute and 8 at five minutes. The baby was initially limp so the umbilical cord was quickly double clamped and cut, the baby was handed off to waiting Pediatric team.  Venous cord gas was obtained. The placenta was manually removed and handed off to be sent as a specimen for Pathology.There were very adherent membranes present that were teased off of the uterine endometrium with  ring forceps. The uterus was further cleared of all clot and debris with a lap pad.   The uterine incision was repaired with #0 Monocryl in running locked fashion. A second imbricating suture was performed using the same suture. The incision was hemostatic. Ovaries and tubes were inspected and normal. The abdomen was irrigated with warm sterile normal  saline and the abdominal cavity cleared of clots and debris. The Alexis retractor was removed. The abdominal peritoneum was reapproximated with 2-0 chromic  in a running fashion, the rectus muscles was reapproximated with #2 chromic in interrupted fashion.The fascia was closed with 0 Vicryl in a running fashion. The subcuticular layer was irrigated and all bleeders cauterized.  30 mL of 0.5% Marcaine was injected into the subcutaneous layer.  Scarpas fascia / subcutaneous tissue was re-approximated with interrupted sutures of 2-0 plain.   The skin was closed with 4-0 vicryl in a subcuticular fashion using a Lanny Hurst needle. The incision was dressed with benzoine, steri strips, a honeycomb dressing followed by a pressure dressing. All sponge lap and needle counts were correct x3.   Patient tolerated the procedure well and was transferred to the PACU in stable condition.  Instrument, sponge, and needle counts were correct prior the abdominal closure and at the conclusion of the case.   Findings: Live female infant, Apgars 6/8, anterior adherent placenta (complete previa) small fibroid <1 cm on anterior surface of uterus, normal bilateral tubes and ovaries  Estimated Blood Loss: 1000 mL  IVF:  1700 mL LR         Drains: Foley catheter  Urine output: 400 mL clear         Specimens: Placenta to Pathology         Implants: none         Complications:  None; patient tolerated the procedure well.         Disposition: PACU - hemodynamically stable.  Attending Attestation: I PERFORMED THE PROCEDURE AND THE ASSISTANCE OF DR. Nehemiah Settle WAS NEEDED FOR THE COMPLEXITY OF THE CASE   Caffie Damme MD

## 2021-05-30 LAB — CBC
HCT: 32.6 % — ABNORMAL LOW (ref 36.0–46.0)
Hemoglobin: 10.7 g/dL — ABNORMAL LOW (ref 12.0–15.0)
MCH: 28.5 pg (ref 26.0–34.0)
MCHC: 32.8 g/dL (ref 30.0–36.0)
MCV: 86.7 fL (ref 80.0–100.0)
Platelets: 248 10*3/uL (ref 150–400)
RBC: 3.76 MIL/uL — ABNORMAL LOW (ref 3.87–5.11)
RDW: 20 % — ABNORMAL HIGH (ref 11.5–15.5)
WBC: 18.5 10*3/uL — ABNORMAL HIGH (ref 4.0–10.5)
nRBC: 0 % (ref 0.0–0.2)

## 2021-05-30 NOTE — Progress Notes (Signed)
Subjective: POD# 1 Information for the patient's newborn:  Kailani, Brass [[id number]]  female   Baby in NICU  Reports feeling "really good" Feeding: pumping Reports tolerating PO and denies N/V, foley removed, ambulating and urinating w/o difficulty  Pain controlled with prescription NSAID's including ketorolac (Toradol) and tylenol Denies HA/SOB/dizziness  Flatus present Vaginal bleeding is normal, no clots     Objective:  VS:  Vitals:   05/30/21 0120 05/30/21 0313 05/30/21 0502 05/30/21 0826  BP:   116/69 120/66  Pulse:   62 68  Resp: 17 16 17  (!) 22  Temp:   97.9 F (36.6 C) 97.8 F (36.6 C)  TempSrc:   Oral Oral  SpO2:  98% 97% 97%  Weight:      Height:        Intake/Output Summary (Last 24 hours) at 05/30/2021 1025 Last data filed at 05/30/2021 0535 Gross per 24 hour  Intake 3806.68 ml  Output 2000 ml  Net 1806.68 ml     Recent Labs    05/28/21 1202 05/30/21 0416  WBC 10.6* 18.5*  HGB 13.4 10.7*  HCT 40.1 32.6*  PLT 286 248    Blood type: --/--/A POS (11/07 1202) Rubella: Immune (05/24 0000)    Physical Exam:  General: alert, cooperative, and no distress CV: Regular rate and rhythm Resp: clear Abdomen: soft, nontender, normal bowel sounds Incision: clean, dry, and intact Perineum:  Uterine Fundus: firm, below umbilicus, nontender Lochia: minimal Ext: extremities normal, atraumatic, no cyanosis or edema   Assessment/Plan: 20 y.o.   POD# 1. G1P1001 Primary cesarean for placental previa                 Principal Problem:   Placenta previa before labor and cesarean delivery without hemorrhage Active Problems:   Status post primary low transverse cesarean section   Complete placenta previa nos or without hemorrhage, third trimester   Routine post-op PP care          Advance diet as tolerated Advised warm fluids and ambulation to improve GI motility Breastfeeding support Anticipate D/C 06/01/21 Dr Charlesetta Garibaldi aware of pt status and  [place], MSN, CNM 05/30/2021, 10:25 AM

## 2021-05-30 NOTE — Progress Notes (Signed)
Patient screened out for psychosocial assessment since none of the following apply: °Psychosocial stressors documented in mother or baby's chart °Gestation less than 32 weeks °Code at delivery  °Infant with anomalies °Please contact the Clinical Social Worker if specific needs arise, by MOB's request, or if MOB scores greater than 9/yes to question 10 on Edinburgh Postpartum Depression Screen. ° °Tanya Myers, MSW, LCSW °Clinical Social Work °[phone] °  °

## 2021-05-31 ENCOUNTER — Encounter (HOSPITAL_COMMUNITY)
Admission: RE | Disposition: A | Discharge status: Home / Self Care | Payer: Self-pay | Source: Home / Self Care | Attending: Obstetrics & Gynecology

## 2021-05-31 SURGERY — Surgical Case
Anesthesia: Regional

## 2021-05-31 NOTE — Progress Notes (Signed)
Subjective: Postpartum Day 2: Cesarean Delivery Patient reports tolerating PO, + flatus, + BM, and no problems voiding.    Objective: Vital signs in last 24 hours: Temp:  [97.5 F (36.4 C)-98 F (36.7 C)] 98 F (36.7 C) (11/10 0504) Pulse Rate:  [59-75] 70 (11/10 0504) Resp:  [16-18] 16 (11/10 0504) BP: (115-127)/(64-80) 115/65 (11/10 0504) SpO2:  [98 %] 98 % (11/10 0504)  Physical Exam:  General: alert, cooperative, and no distress Lochia: appropriate Uterine Fundus: firm Incision: healing well, no significant drainage, no dehiscence, no significant erythema DVT Evaluation: No evidence of DVT seen on physical exam. Negative Homan's sign. No cords or calf tenderness.  Recent Labs    05/30/21 0416  HGB 10.7*  HCT 32.6*    Assessment/Plan: Status post Cesarean section. Doing well postoperatively.  Continue current care. Baby in NICU Will discharge home tomorrow  Sanjuana Kava 05/31/2021, 1:57 PM

## 2021-05-31 NOTE — Lactation Note (Signed)
This note was copied from a baby's chart. Lactation Consultation Note  Patient Name: Tanya Myers [id number] Date: 05/31/2021 Reason for consult: Follow-up assessment;Primapara;1st time breastfeeding;NICU baby;Early term 37-38.6wks Age:21 hours   Baby Tanya Myers transferred to the NICU early on 11/8. Tanya Myers plans to exclusively pump and bottle feed. She is amenable to giving baby ABM in the NICU and plans to supplement with ABM if needed.   She has been pumping inconsistently. I educated on importance of pumping every three hours and we reviewed milk storage guidelines and engorgement prevention and care from the [facility] guide.  I provided our brochure with NICU phone number. All questions answered at this time.   Maternal Data Does the patient have breastfeeding experience prior to this delivery?: No  Feeding Mother's Current Feeding Choice: Breast Milk and Formula Nipple Type: Slow - flow   Lactation Tools Discussed/Used Tools: Pump Breast pump type: Double-Electric Breast Pump Pump Education: Setup, frequency, and cleaning;Milk Storage Reason for Pumping: NICU Pumping frequency: did not pump overnight; recommended pumping q3 hours Pumped volume: 0 mL  Interventions Interventions: Breast feeding basics reviewed;DEBP;Education;LC Services brochure [facility] Guide)  Discharge Pump: DEBP;Personal (Ameda - from [employer])  Consult Status Consult Status: Follow-up Date: 05/31/21 Follow-up type: In-patient    Tanya Myers 05/31/2021, 10:33 AM

## 2021-06-01 LAB — SURGICAL PATHOLOGY

## 2021-06-01 MED ORDER — IBUPROFEN 600 MG PO TABS: 600.0000 mg | ORAL_TABLET | Freq: Four times a day (QID) | ORAL | 0 refills | Status: DC

## 2021-06-01 MED ORDER — OXYCODONE HCL 5 MG PO TABS
5.0000 mg | ORAL_TABLET | ORAL | 0 refills | Status: DC | PRN
Start: 2021-06-01 — End: 2023-07-10

## 2021-06-01 NOTE — Discharge Summary (Signed)
PCS OB Discharge Summary     Patient Name: Tanya Myers DOB: 13-Jun-2001 MRN: [id number]  Date of admission: 05/29/2021 Delivering MD: Sanjuana Kava  Date of delivery: 05/29/2021 Type of delivery: PCS  Newborn Data: Sex: Baby female in NICU for cardiac murmur, hypoglycemia, and anemia.  Live born female  Birth Weight: 4 lb 15 oz (2240 g) APGAR: 6, 8  Newborn Delivery   Birth date/time: 05/29/2021 14:33:00 Delivery type: C-Section, Low Transverse Trial of labor: No C-section categorization: Primary      Feeding: breast and bottle Infant being discharge to home with mother in stable condition.   Admitting diagnosis: Status post primary low transverse cesarean section [Z98.891] Complete placenta previa nos or without hemorrhage, third trimester [O44.03] Intrauterine pregnancy: [redacted]w[redacted]d     Secondary diagnosis:  Principal Problem:   Placenta previa before labor and cesarean delivery without hemorrhage Active Problems:   Status post primary low transverse cesarean section   Complete placenta previa nos or without hemorrhage, third trimester   Postpartum hemorrhage   Normal postpartum course                                Complications: [id number]                                                              Intrapartum Procedures: cesarean: low cervical, transverse Postpartum Procedures: none Complications-Operative and Postpartum: none Augmentation: AROM and at delivery   History of Present Illness: Ms. Tanya Myers is a 20 y.o. female, G1P1001, who presents at [redacted]w[redacted]d weeks gestation. The patient has been followed at  [facility]  Her pregnancy has been complicated by:  Patient Active Problem List   Diagnosis Date Noted   Postpartum hemorrhage 06/01/2021   Normal postpartum course 06/01/2021   Placenta previa before labor and cesarean delivery without hemorrhage 05/29/2021   Status post primary low transverse cesarean section  05/29/2021   Complete placenta previa nos or without hemorrhage, third trimester 05/29/2021     Active Ambulatory Problems    Diagnosis Date Noted   No Active Ambulatory Problems   Resolved Ambulatory Problems    Diagnosis Date Noted   No Resolved Ambulatory Problems   Past Medical History:  Diagnosis Date   Medical history non-contributory    Placenta previa      Hospital course:  Sceduled C/S   20 y.o. yo G1P1001 at [redacted]w[redacted]d was admitted to the hospital 05/29/2021 for scheduled cesarean section with the following indication:Previa.Delivery details are as follows:  Membrane Rupture Time/Date: 2:30 PM ,05/29/2021   Delivery Method:C-Section, Low Transverse  Details of operation can be found in separate operative note.  Patient had an uncomplicated postpartum course.  She is ambulating, tolerating a regular diet, passing flatus, and urinating well. Patient is discharged home in stable condition on  06/01/21        Newborn Data: Birth date:05/29/2021  Birth time:2:33 PM  Gender:Female  Living status:Living  Apgars:6 ,8  Weight:2240 g    Postpartum PODay # 3 :   Hospital Course--Scheduled Cesarean: Patient was admitted on 05/29/2021 for a scheduled Primary cesarean delivery.   She was taken to the operating room, where Dr. Alwyn Pea performed a Primary LTCS  under spinal anesthesia, for complete placenta previa at 37.1 weeks with delivery of a viable baby female, with weight and Apgars as listed above. Infant was in good condition and remained at the patient's bedside, then went to the NICU for hypoglycemia, cardiac murmur and anemia.  The patient was taken to recovery in good condition.  Patient planned to bottle and breast feed.  On post-op day 1, patient was doing well, tolerating a regular diet, with Hgb of 13.4-10.7 with total QBL of 1015mls, considered PPH, asymptomatic.  Throughout her stay, her physical exam was WNL, her incision was CDI, and her vital signs remained stable.  By post-op day  1, she was up ad lib, tolerating a regular diet, with good pain control with po med.  She was deemed to have received the full benefit of her hospital stay, and was discharged home in stable condition.  Contraceptive choice was undecided.  Pt planning on rooming in .   Physical exam  Vitals:   05/31/21 0504 05/31/21 1437 05/31/21 2026 06/01/21 0657  BP: 115/65 123/72 133/81 132/86  Pulse: 70 79 75 87  Resp: 16 18  15   Temp: 98 F (36.7 C) 98.2 F (36.8 C) 97.6 F (36.4 C) 98.6 F (37 C)  TempSrc: Oral Oral  Oral  SpO2: 98% 99% 98% 99%  Weight:      Height:       General: alert, cooperative, and no distress Lochia: appropriate Uterine Fundus: firm Incision: Healing well with no significant drainage, No significant erythema, Dressing is clean, dry, and intact, honeycomb dressing CDI Perineum: intact DVT Evaluation: No evidence of DVT seen on physical exam. Negative Homan's sign. No cords or calf tenderness. No significant calf/ankle edema.  Labs: Lab Results  Component Value Date   WBC 18.5 (H) 05/30/2021   HGB 10.7 (L) 05/30/2021   HCT 32.6 (L) 05/30/2021   MCV 86.7 05/30/2021   PLT 248 05/30/2021   CMP Latest Ref Rng & Units 11/15/2020  Glucose 70 - 99 mg/dL 88  BUN 6 - 20 mg/dL 9  Creatinine 0.44 - 1.00 mg/dL 0.62  Sodium 135 - 145 mmol/L 134(L)  Potassium 3.5 - 5.1 mmol/L 3.5  Chloride 98 - 111 mmol/L 104  CO2 22 - 32 mmol/L 20(L)  Calcium 8.9 - 10.3 mg/dL 9.6    Date of discharge: 06/01/2021 Discharge Diagnoses:  Early term NB with Complete placenta previa Discharge instruction: per After Visit Summary and "Baby and Me Booklet".  After visit meds:   Activity:           unrestricted and pelvic rest Advance as tolerated. Pelvic rest for 6 weeks.  Diet:                routine Medications: PNV, Ibuprofen, and oxy IR Postpartum contraception: Undecided Condition:  Pt discharge to room in with baby in stable condition   Meds: Allergies as of 06/01/2021   No  Known Allergies      Medication List     TAKE these medications    ferrous sulfate 325 (65 FE) MG tablet Take 325 mg by mouth daily in the afternoon.   ibuprofen 600 MG tablet Commonly known as: ADVIL Take 1 tablet (600 mg total) by mouth every 6 (six) hours.   multivitamin-prenatal 27-0.8 MG Tabs tablet Take 1 tablet by mouth in the morning.   oxyCODONE 5 MG immediate release tablet Commonly known as: Oxy IR/ROXICODONE Take 1-2 tablets (5-10 mg total) by mouth every 4 (four) hours  as needed for moderate pain.   Vitamin D3 50 MCG (2000 UT) Tabs Take 2,000 Units by mouth in the morning.               Discharge Care Instructions  (From admission, onward)           Start     Ordered   06/01/21 0000  Discharge wound care:       Comments: Take dressing off on day 5-7 postpartum.  Report increased drainage, redness or warmth. Clean with water, let soap trickle down body. Can leave steri strips on until they fall off or take them off gently at day 10. Keep open to air, clean and dry.   06/01/21 0805            Discharge Follow Up:   Follow-up [facility]. Schedule an appointment as soon as possible for a visit.   Specialty: Obstetrics and Gynecology Why: 10 with DR Alwyn Pea, then 6 weeks PP Contact information: [address] [place]  [phone] [place], NP-C, CNM 06/01/2021, 8:05 AM  Noralyn Pick, [place]

## 2021-06-02 ENCOUNTER — Ambulatory Visit: Payer: Self-pay

## 2021-06-02 NOTE — Lactation Note (Signed)
This note was copied from a baby's chart.  NICU Lactation Consultation Note  Patient Name: Tanya Myers Date: 06/02/2021 Age:21 days   Subjective Reason for consult: Follow-up assessment Mother is pumping q3 hours. She denies pain or difficulty. No s/s of engorgement.  Mother plans to pump and bottle feed only.  Objective Infant data: Mother's Current Feeding Choice: Breast Milk  Infant feeding assessment Scale for Readiness: 2 Scale for Quality: 1   Maternal data: G1P1001  C-Section, Low Transverse  Pumping frequency: q3 Pumped volume: 240 mL  Pump: Personal (Ardo double electric)  Assessment Infant: Feeding Status: Ad lib  Maternal: Milk volume: Abundant  Intervention/Plan Interventions: Education  Plan: Consult Status: Follow-up  NICU Follow-up type: Weekly NICU follow up Mother will continue pumping q3   Gwynne Edinger 06/02/2021, 1:50 PM

## 2021-06-03 ENCOUNTER — Ambulatory Visit: Payer: Self-pay

## 2021-06-03 NOTE — Lactation Note (Signed)
This note was copied from a baby's chart. Lactation Consultation Note  Patient Name: Tanya Myers GNFAO'Z Date: 06/03/2021 Reason for consult: Follow-up assessment;Other (Comment);NICU baby;Infant < 6lbs;Early term 37-38.6wks (baby's D/C) Age:21 days  Visited with mom of 80 days old ETI NICU female, baby has been at lib and will be discharged today. Reviewed discharge education, engorgement prevention/treatment, sore nipples and pumping schedule.  Mom requested to be F/U by [facility] OP, Epic message to [facility]. was sent. This LC also offered mom a [facility] loaner since the DEBP that she ordered was shipped to her old address (G[facility]).  Mom will let lactation know if she'd like to do the loaner pump. All questions and concerns answered, mom to call NICU LC PRN.  Maternal Data  Mom's supply is abundant  Feeding Mother's Current Feeding Choice: Breast Milk  Lactation Tools Discussed/Used Tools: Pump;Flanges Flange Size: 24 Breast pump type: Double-Electric Breast Pump Pump Education: Setup, frequency, and cleaning;Milk Storage Reason for Pumping: ETI in NICU Pumping frequency: 8 times/24 hours Pumped volume: 100 mL (100-140 ml)  Interventions Interventions: Breast feeding basics reviewed;Education  Discharge Discharge Education: Engorgement and breast care;Outpatient recommendation;Outpatient Epic message sent Pump: Personal (Ardo DEBP but it won't arrive until the weeked (5-6 days from today))  Consult Status Consult Status: Follow-up Date: 06/03/21 Follow-up type: Call as needed    [place] 06/03/2021, 11:53 AM

## 2021-06-03 NOTE — Lactation Note (Signed)
This note was copied from a baby's chart. Lactation Consultation Note  Patient Name: Tanya Myers YFVCB'S Date: 06/03/2021 Reason for consult: Follow-up assessment;Other (Comment);NICU baby;Infant < 6lbs;Early term 37-38.6wks (baby's D/C) Age:21 days  FOB came to [place] with the deposit to do the [facility] loaner. [employer] pump # [id number] was issued. Family was very appreciate, FOB will return pump to MAU as instructed before 06/15/2021.  Feeding Mother's Current Feeding Choice: Breast Milk Nipple Type: Other (Avent Level 4)  Lactation Tools Discussed/Used Tools: Pump;Flanges Flange Size: 24 Breast pump type: Double-Electric Breast Pump Pump Education: Setup, frequency, and cleaning;Milk Storage Reason for Pumping: ETI in NICU Pumping frequency: 8 times/24 hours Pumped volume: 100 mL (100-140 ml)  Interventions Interventions: Breast feeding basics reviewed;Education  Discharge Discharge Education: Engorgement and breast care;Outpatient recommendation;Outpatient Epic message sent Pump: Personal (Ardo DEBP but it won't arrive until the weeked (5-6 days from today))  Consult Status Consult Status: Follow-up Date: 06/03/21 Follow-up type: Call as needed   [place] 06/03/2021, 12:12 PM

## 2021-06-11 ENCOUNTER — Telehealth (HOSPITAL_COMMUNITY): Payer: Self-pay | Admitting: *Deleted

## 2021-06-11 NOTE — Telephone Encounter (Signed)
Mom returned nurse call. She reports feeling good. No concerns about herself at this time. EPDS= 0 [facility] score= 0) Mom reports baby is doing well. Feeding, peeing, and pooping without difficulty. Safe sleep reviewed. Mom reports no concerns about baby at present.  Odis Hollingshead, RN 06-11-2021 at 3:44pm

## 2021-06-11 NOTE — Telephone Encounter (Signed)
Attempted Hospital Discharge Follow-Up Call.  Left voice mail requesting that patient return RN's phone call.  

## 2021-07-18 DIAGNOSIS — Z3040 Encounter for surveillance of contraceptives, unspecified: Secondary | ICD-10-CM | POA: Diagnosis not present

## 2022-01-15 DIAGNOSIS — Z3040 Encounter for surveillance of contraceptives, unspecified: Secondary | ICD-10-CM | POA: Diagnosis not present

## 2022-01-15 DIAGNOSIS — Z0000 Encounter for general adult medical examination without abnormal findings: Secondary | ICD-10-CM | POA: Diagnosis not present

## 2022-01-15 DIAGNOSIS — N760 Acute vaginitis: Secondary | ICD-10-CM | POA: Diagnosis not present

## 2022-01-15 DIAGNOSIS — Z124 Encounter for screening for malignant neoplasm of cervix: Secondary | ICD-10-CM | POA: Diagnosis not present

## 2022-01-15 DIAGNOSIS — E559 Vitamin D deficiency, unspecified: Secondary | ICD-10-CM | POA: Diagnosis not present

## 2022-01-15 DIAGNOSIS — Z862 Personal history of diseases of the blood and blood-forming organs and certain disorders involving the immune mechanism: Secondary | ICD-10-CM | POA: Diagnosis not present

## 2022-01-15 DIAGNOSIS — Z01419 Encounter for gynecological examination (general) (routine) without abnormal findings: Secondary | ICD-10-CM | POA: Diagnosis not present

## 2022-08-25 IMAGING — US US OB COMP LESS 14 WK
1 series · 15 of 28 positions shown · non-contrast
Comparison: None.

CLINICAL DATA: Vaginal bleeding today. Estimated gestational age by
LMP is 9 weeks 2 days. Quantitative beta HCG is not provided.

EXAM:
OBSTETRIC <14 WK US AND TRANSVAGINAL OB US
TECHNIQUE: Both transabdominal and transvaginal ultrasound examinations were
performed for complete evaluation of the gestation as well as the
maternal uterus, adnexal regions, and pelvic cul-de-sac.
Transvaginal technique was performed to assess early pregnancy.

[Series 1: us ob comp less 14 wk · 15 of 40 slices shown]
[im 1/40]
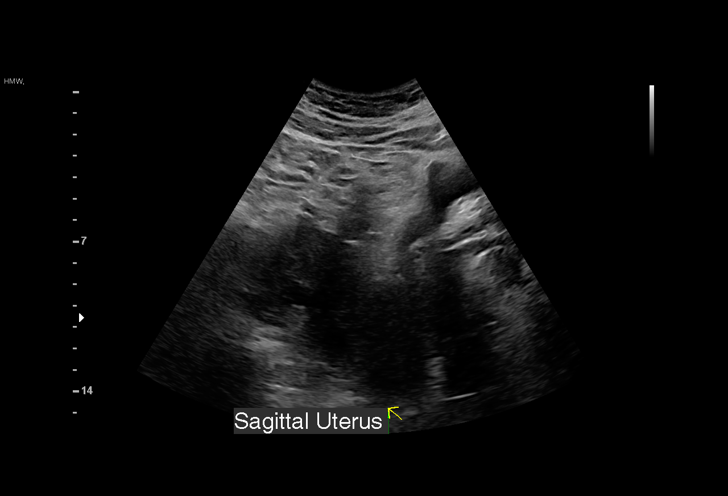
[im 3/40]
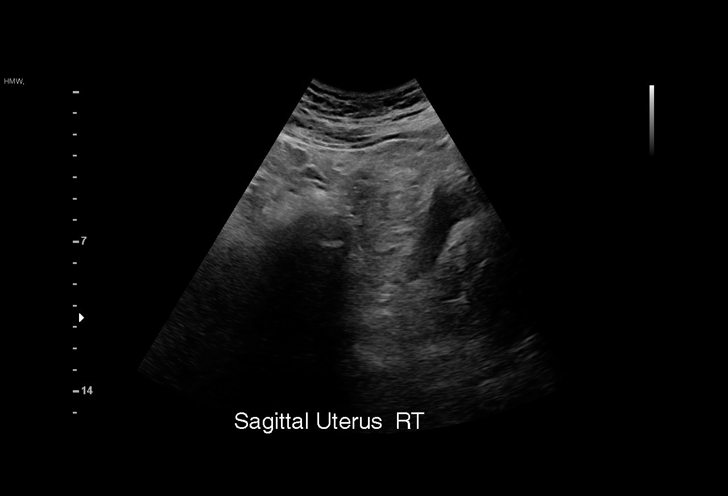
[im 6/40]
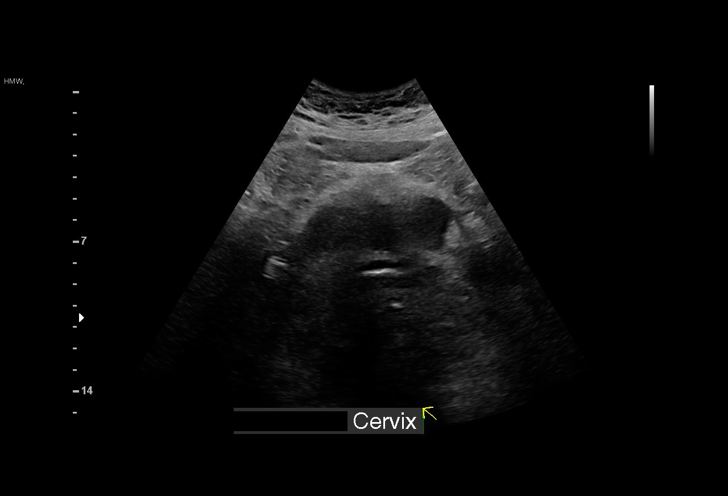
[im 9/40]
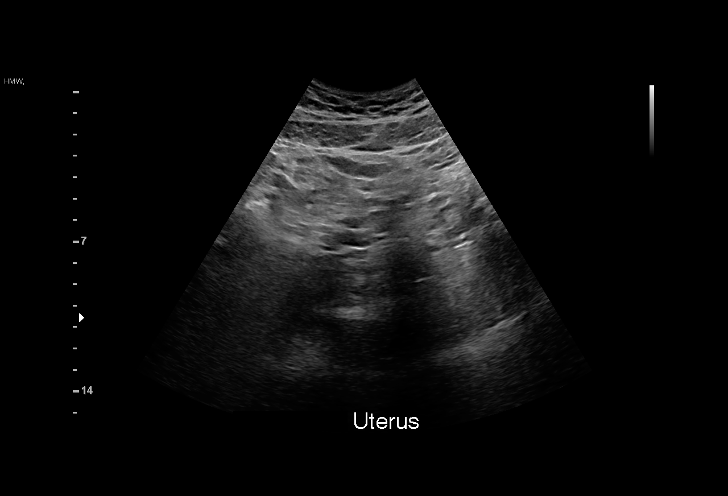
[im 12/40]
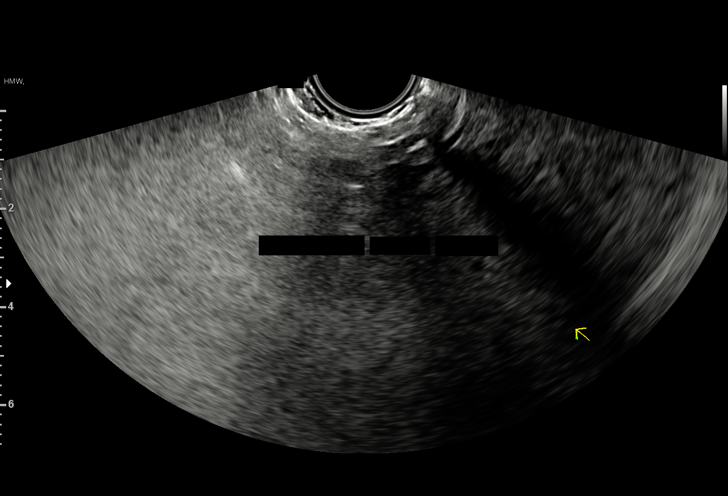
[im 15/40]
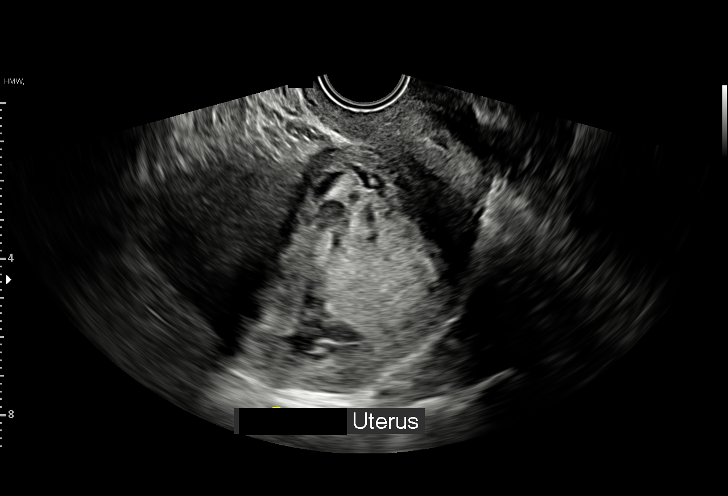
[im 18/40]
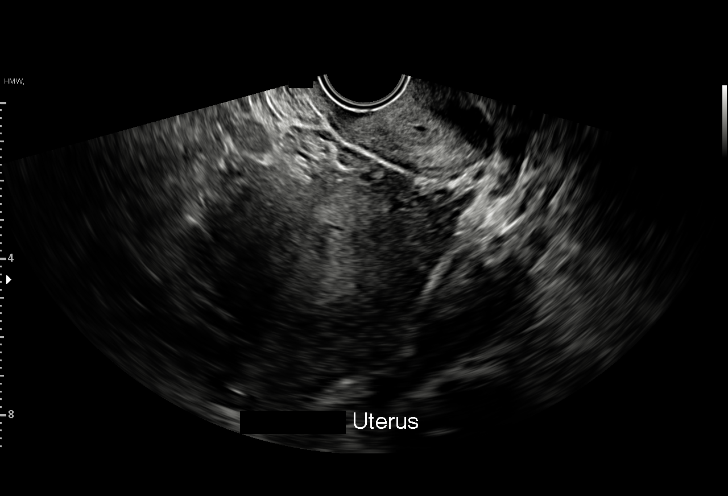
[im 21/40]
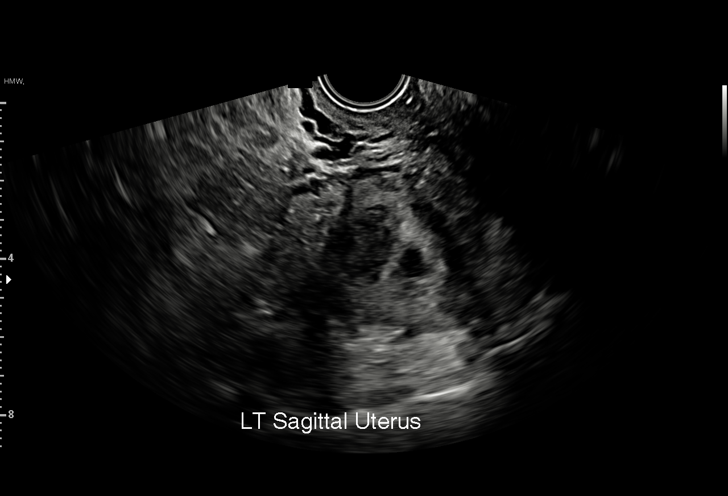
[im 22/40]
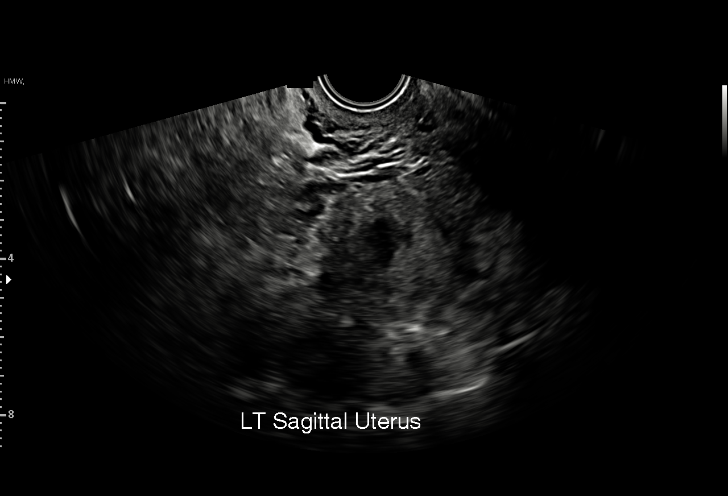
[im 25/40]
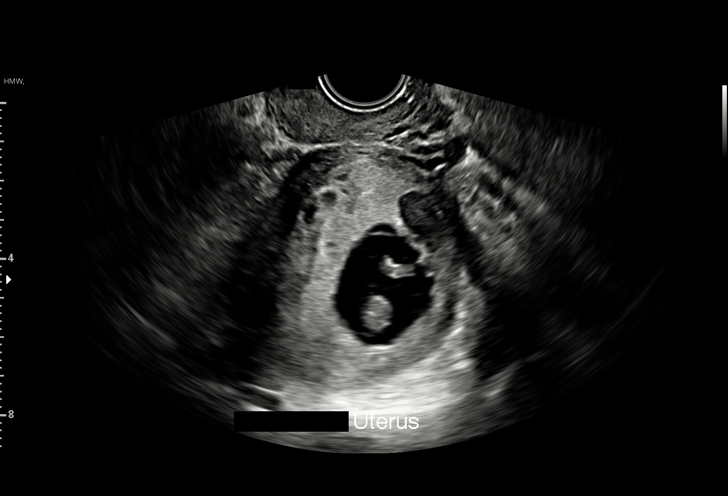
[im 28/40]
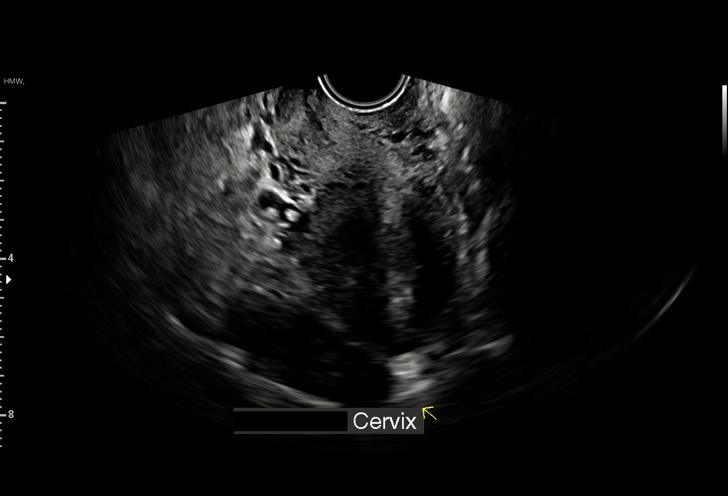
[im 31/40]
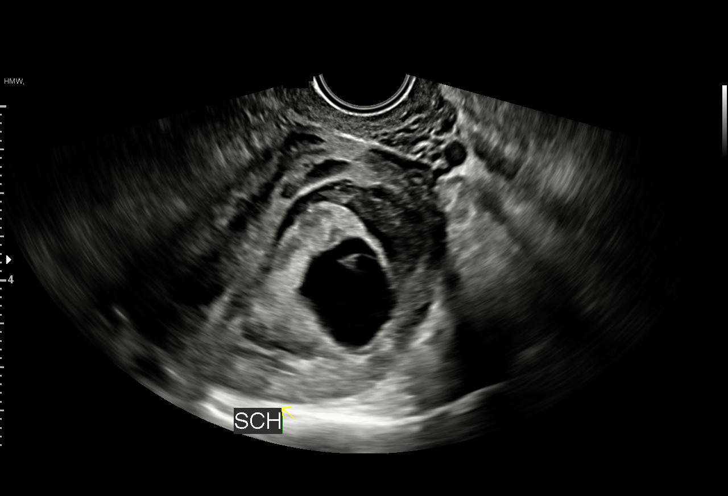
[im 34/40]
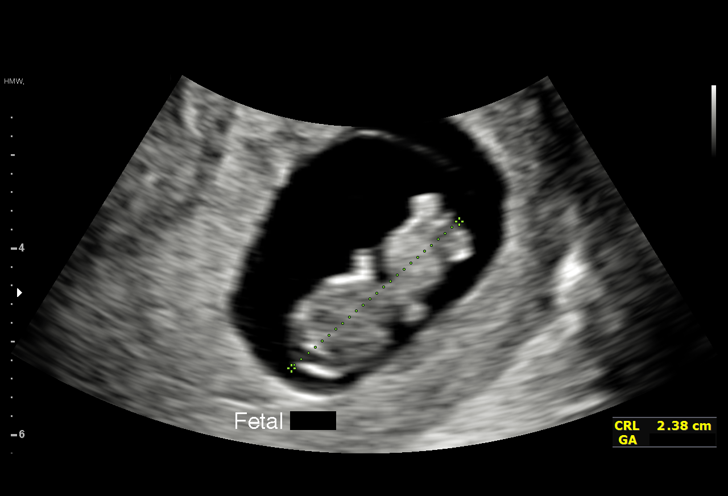
[im 37/40]
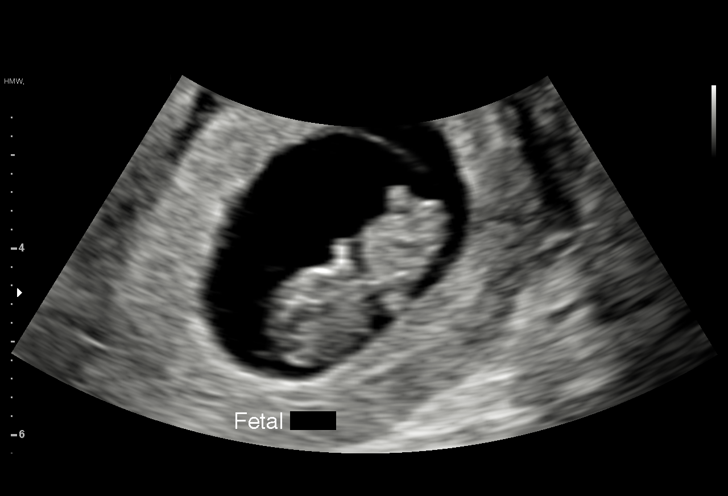
[im 40/40]
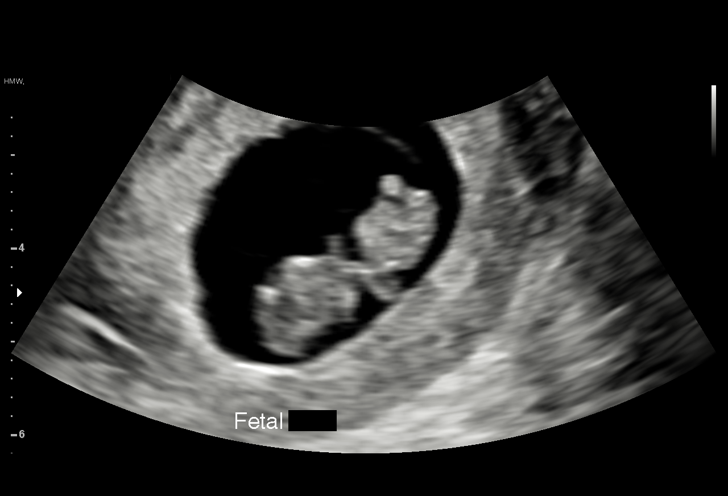

[15 of 28 positions shown; findings below may reference images not displayed]

FINDINGS: Intrauterine gestational sac: A single intrauterine gestational sac
is identified.

Yolk sac:  The yolk sac is not visualized.

Embryo:  Fetal pole is identified.

Cardiac Activity: Fetal cardiac activity is observed.

Heart Rate: 174 bpm

CRL: 24.1 mm   9 w   1 d                  US EDC: 06/19/2021

Subchorionic hemorrhage: A small subchorionic hemorrhage is
identified inferiorly, measuring 2.9 x 1.1 cm.

Maternal uterus/adnexae: Uterus appears retroverted. No myometrial
mass lesions. Small nabothian cysts in the cervix. Ovaries are not
visualized but no abnormal adnexal masses are seen. No abnormal
pelvic fluid collections.
IMPRESSION: Single intrauterine pregnancy. Estimated gestational age by
crown-rump length is 9 weeks 1 day. Small subchorionic hemorrhage.

## 2023-01-24 DIAGNOSIS — Z3482 Encounter for supervision of other normal pregnancy, second trimester: Secondary | ICD-10-CM | POA: Diagnosis not present

## 2023-01-24 DIAGNOSIS — Z331 Pregnant state, incidental: Secondary | ICD-10-CM | POA: Diagnosis not present

## 2023-01-24 DIAGNOSIS — Z362 Encounter for other antenatal screening follow-up: Secondary | ICD-10-CM | POA: Diagnosis not present

## 2023-01-24 DIAGNOSIS — Z113 Encounter for screening for infections with a predominantly sexual mode of transmission: Secondary | ICD-10-CM | POA: Diagnosis not present

## 2023-01-24 DIAGNOSIS — N925 Other specified irregular menstruation: Secondary | ICD-10-CM | POA: Diagnosis not present

## 2023-01-24 LAB — OB RESULTS CONSOLE RUBELLA ANTIBODY, IGM: Rubella: IMMUNE

## 2023-01-24 LAB — OB RESULTS CONSOLE ANTIBODY SCREEN: Antibody Screen: NEGATIVE

## 2023-01-24 LAB — OB RESULTS CONSOLE HEPATITIS B SURFACE ANTIGEN: Hepatitis B Surface Ag: NEGATIVE

## 2023-01-24 LAB — HEPATITIS C ANTIBODY: HCV Ab: NEGATIVE

## 2023-01-24 LAB — OB RESULTS CONSOLE HIV ANTIBODY (ROUTINE TESTING): HIV: NONREACTIVE

## 2023-01-24 LAB — OB RESULTS CONSOLE GC/CHLAMYDIA
Chlamydia: NEGATIVE
Neisseria Gonorrhea: NEGATIVE

## 2023-01-24 LAB — OB RESULTS CONSOLE RPR: RPR: NONREACTIVE

## 2023-02-20 DIAGNOSIS — Z3A17 17 weeks gestation of pregnancy: Secondary | ICD-10-CM | POA: Diagnosis not present

## 2023-02-20 DIAGNOSIS — O26851 Spotting complicating pregnancy, first trimester: Secondary | ICD-10-CM | POA: Diagnosis not present

## 2023-03-18 DIAGNOSIS — Z3A21 21 weeks gestation of pregnancy: Secondary | ICD-10-CM | POA: Diagnosis not present

## 2023-03-18 DIAGNOSIS — Z363 Encounter for antenatal screening for malformations: Secondary | ICD-10-CM | POA: Diagnosis not present

## 2023-04-14 ENCOUNTER — Other Ambulatory Visit: Payer: Self-pay | Admitting: Obstetrics and Gynecology

## 2023-04-22 ENCOUNTER — Other Ambulatory Visit: Payer: Self-pay | Admitting: Obstetrics and Gynecology

## 2023-05-02 DIAGNOSIS — Z362 Encounter for other antenatal screening follow-up: Secondary | ICD-10-CM | POA: Diagnosis not present

## 2023-06-02 ENCOUNTER — Other Ambulatory Visit: Payer: Self-pay | Admitting: Obstetrics and Gynecology

## 2023-07-04 ENCOUNTER — Encounter (HOSPITAL_COMMUNITY): Payer: Self-pay | Admitting: *Deleted

## 2023-07-04 NOTE — Patient Instructions (Signed)
Kallie Dieterich  07/04/2023   Your procedure is scheduled on:  07/20/2023  Arrive at 0930 at Entrance C on [address] at [facility]  and [employer]. You are invited to use the FREE valet parking or use the Visitor's parking deck.  Pick up the phone at the desk and dial [phone].  Call this number if you have problems the morning of surgery: [phone]  Remember:   Do not eat food:(After Midnight) Desps de medianoche.  You may drink clear liquids until arrival at __0930___.  Clear liquids means a liquid you can see thru.  It can have color such as Cola or Kool aid.  Tea is OK and coffee as long as no milk or creamer of any kind.  Take these medicines the morning of surgery with A SIP OF WATER:  none   Do not wear jewelry, make-up or nail polish.  Do not wear lotions, powders, or perfumes. Do not wear deodorant.  Do not shave 48 hours prior to surgery.  Do not bring valuables to the hospital.  [facility] is not   responsible for any belongings or valuables brought to the hospital.  Contacts, dentures or bridgework may not be worn into surgery.  Leave suitcase in the car. After surgery it may be brought to your room.  For patients admitted to the hospital, checkout time is 11:00 AM the day of              discharge.      Please read over the following fact sheets that you were given:     Preparing for Surgery

## 2023-07-07 ENCOUNTER — Other Ambulatory Visit: Payer: Self-pay | Admitting: Obstetrics and Gynecology

## 2023-07-08 ENCOUNTER — Encounter (HOSPITAL_COMMUNITY): Payer: Self-pay

## 2023-07-08 ENCOUNTER — Telehealth (HOSPITAL_COMMUNITY): Payer: Self-pay | Admitting: *Deleted

## 2023-07-08 NOTE — Telephone Encounter (Signed)
Preadmission screen  

## 2023-07-09 ENCOUNTER — Telehealth (HOSPITAL_COMMUNITY): Payer: Self-pay | Admitting: *Deleted

## 2023-07-09 NOTE — Telephone Encounter (Signed)
Preadmission screen  

## 2023-07-10 ENCOUNTER — Encounter (HOSPITAL_COMMUNITY): Payer: Self-pay

## 2023-07-10 ENCOUNTER — Telehealth (HOSPITAL_COMMUNITY): Payer: Self-pay | Admitting: *Deleted

## 2023-07-10 NOTE — Telephone Encounter (Signed)
Preadmission screen  

## 2023-07-14 DIAGNOSIS — Z331 Pregnant state, incidental: Secondary | ICD-10-CM | POA: Diagnosis not present

## 2023-07-14 DIAGNOSIS — Z23 Encounter for immunization: Secondary | ICD-10-CM | POA: Diagnosis not present

## 2023-07-14 DIAGNOSIS — Z3A36 36 weeks gestation of pregnancy: Secondary | ICD-10-CM | POA: Diagnosis not present

## 2023-07-18 ENCOUNTER — Encounter (HOSPITAL_COMMUNITY)
Admission: RE | Admit: 2023-07-18 | Discharge: 2023-07-18 | Disposition: A | Discharge status: Home / Self Care | Payer: Medicaid Other | Source: Ambulatory Visit | Attending: Obstetrics and Gynecology | Admitting: Obstetrics and Gynecology

## 2023-07-18 VITALS — Ht 64.0 in | Wt 204.0 lb

## 2023-07-18 DIAGNOSIS — Z01812 Encounter for preprocedural laboratory examination: Secondary | ICD-10-CM | POA: Insufficient documentation

## 2023-07-18 DIAGNOSIS — O4400 Placenta previa specified as without hemorrhage, unspecified trimester: Secondary | ICD-10-CM | POA: Diagnosis not present

## 2023-07-18 DIAGNOSIS — Z3A00 Weeks of gestation of pregnancy not specified: Secondary | ICD-10-CM | POA: Insufficient documentation

## 2023-07-18 LAB — CBC
HCT: 34.5 % — ABNORMAL LOW (ref 36.0–46.0)
Hemoglobin: 10.7 g/dL — ABNORMAL LOW (ref 12.0–15.0)
MCH: 25.9 pg — ABNORMAL LOW (ref 26.0–34.0)
MCHC: 31 g/dL (ref 30.0–36.0)
MCV: 83.5 fL (ref 80.0–100.0)
Platelets: 266 10*3/uL (ref 150–400)
RBC: 4.13 MIL/uL (ref 3.87–5.11)
RDW: 18.2 % — ABNORMAL HIGH (ref 11.5–15.5)
WBC: 10.8 10*3/uL — ABNORMAL HIGH (ref 4.0–10.5)
nRBC: 0 % (ref 0.0–0.2)

## 2023-07-18 LAB — TYPE AND SCREEN
ABO/RH(D): A POS
Antibody Screen: NEGATIVE

## 2023-07-19 ENCOUNTER — Encounter (HOSPITAL_COMMUNITY): Payer: Self-pay | Admitting: Obstetrics and Gynecology

## 2023-07-19 LAB — RPR: RPR Ser Ql: NONREACTIVE

## 2023-07-19 NOTE — H&P (Signed)
Tanya Myers is a 22 y.o. female presenting for repeat CS.  Pt had a CS in the past for previa.  She was counseled on VBAC vs repeat and desires repeat CS. OB History     Gravida  2   Para  1   Term  1   Preterm      AB      Living  1      SAB      IAB      Ectopic      Multiple  0   Live Births  1          Past Medical History:  Diagnosis Date   Medical history non-contributory    Placenta previa    Past Surgical History:  Procedure Laterality Date   CESAREAN SECTION N/A 05/29/2021   Procedure: CESAREAN SECTION;  Surgeon: Essie Hart, MD;  Location: [facility] LD ORS;  Service: Obstetrics;  Laterality: N/A;   NO PAST SURGERIES     Family History: family history is not on file. Social History:  reports that she has never smoked. She has never used smokeless tobacco. She reports that she does not drink alcohol and does not use drugs.     Maternal Diabetes: No Genetic Screening: Normal Maternal Ultrasounds/Referrals: Normal Fetal Ultrasounds or other Referrals:  None Maternal Substance Abuse:  No Significant Maternal Medications:  None Significant Maternal Lab Results:  Other:  Number of Prenatal Visits:greater than 3 verified prenatal visits Maternal Vaccinations:Covid Other Comments:  None  Review of Systems History   unknown if currently breastfeeding. Exam Physical Exam  Physical Examination: General appearance - alert, well appearing, and in no distress Chest - clear to auscultation, no wheezes, rales or rhonchi, symmetric air entry Heart - normal rate and regular rhythm Abdomen - soft, nontender, nondistended, no masses or organomegaly gravid Extremities - peripheral pulses normal, no pedal edema, no clubbing or cyanosis, Homan's sign negative bilaterally  Prenatal labs: ABO, Rh: --/--/A POS (12/27 0911) Antibody: NEG (12/27 0911) Rubella: Immune (07/05 0000) RPR: NON REACTIVE (12/27 0900)  HBsAg: Negative (07/05 0000)  HIV: Non-reactive (07/05  0000)  GBS:  unknown   Assessment/Plan: IUP AT 39 WEEKS FOR R CS  R&B REVIEWED PT UNDERSTANDS THE RISKS ARE BUT NOT LIMITED TO BLEEDING, INFECTION DAMAGE TO INTERNAL ORGANS SUCH AS BOWEL AND BLADDER GBS NOT DONE PT MISSED A VISIT SGA EFW 4-15 AT 37 WEEKS   Tanya Myers A Tanya Myers 07/19/2023, 9:12 PM

## 2023-07-20 ENCOUNTER — Inpatient Hospital Stay (HOSPITAL_COMMUNITY): Payer: Medicaid Other

## 2023-07-20 ENCOUNTER — Inpatient Hospital Stay (HOSPITAL_COMMUNITY)
Admission: AD | Admit: 2023-07-20 | Discharge: 2023-07-22 | DRG: 788 | Disposition: A | Discharge status: Home / Self Care | Payer: Medicaid Other | Attending: Obstetrics and Gynecology | Admitting: Obstetrics and Gynecology

## 2023-07-20 ENCOUNTER — Encounter (HOSPITAL_COMMUNITY): Payer: Self-pay | Admitting: Obstetrics and Gynecology

## 2023-07-20 ENCOUNTER — Encounter (HOSPITAL_COMMUNITY)
Admission: AD | Disposition: A | Discharge status: Home / Self Care | Payer: Self-pay | Source: Home / Self Care | Attending: Obstetrics and Gynecology

## 2023-07-20 ENCOUNTER — Other Ambulatory Visit: Payer: Self-pay

## 2023-07-20 DIAGNOSIS — O34211 Maternal care for low transverse scar from previous cesarean delivery: Secondary | ICD-10-CM | POA: Diagnosis not present

## 2023-07-20 DIAGNOSIS — Z98891 History of uterine scar from previous surgery: Secondary | ICD-10-CM | POA: Diagnosis not present

## 2023-07-20 DIAGNOSIS — O9902 Anemia complicating childbirth: Secondary | ICD-10-CM | POA: Diagnosis present

## 2023-07-20 DIAGNOSIS — Z8759 Personal history of other complications of pregnancy, childbirth and the puerperium: Principal | ICD-10-CM

## 2023-07-20 DIAGNOSIS — O34219 Maternal care for unspecified type scar from previous cesarean delivery: Secondary | ICD-10-CM | POA: Diagnosis not present

## 2023-07-20 DIAGNOSIS — Z3A39 39 weeks gestation of pregnancy: Secondary | ICD-10-CM

## 2023-07-20 DIAGNOSIS — Z3A00 Weeks of gestation of pregnancy not specified: Secondary | ICD-10-CM | POA: Diagnosis not present

## 2023-07-20 DIAGNOSIS — O365930 Maternal care for other known or suspected poor fetal growth, third trimester, not applicable or unspecified: Secondary | ICD-10-CM | POA: Diagnosis not present

## 2023-07-20 LAB — CREATININE, SERUM
Creatinine, Ser: 0.5 mg/dL (ref 0.44–1.00)
GFR, Estimated: >60 mL/min (ref >60)

## 2023-07-20 LAB — CBC
HCT: 35 % — ABNORMAL LOW (ref 36.0–46.0)
Hemoglobin: 11.1 g/dL — ABNORMAL LOW (ref 12.0–15.0)
MCH: 26.2 pg (ref 26.0–34.0)
MCHC: 31.7 g/dL (ref 30.0–36.0)
MCV: 82.7 fL (ref 80.0–100.0)
Platelets: 207 10*3/uL (ref 150–400)
RBC: 4.23 MIL/uL (ref 3.87–5.11)
RDW: 18.5 % — ABNORMAL HIGH (ref 11.5–15.5)
WBC: 17.3 10*3/uL — ABNORMAL HIGH (ref 4.0–10.5)
nRBC: 0 % (ref 0.0–0.2)

## 2023-07-20 SURGERY — Surgical Case
Anesthesia: Spinal

## 2023-07-20 MED ORDER — ACETAMINOPHEN 325 MG PO TABS
650.0000 mg | ORAL_TABLET | ORAL | Status: DC | PRN
  Administered 2023-07-20 – 2023-07-22 (×5): 650 mg via ORAL
  Filled 2023-07-20 (×5): qty 2

## 2023-07-20 MED ORDER — TETANUS-DIPHTH-ACELL PERTUSSIS 5-2.5-18.5 LF-MCG/0.5 IM SUSY: 0.5000 mL | PREFILLED_SYRINGE | Freq: Once | INTRAMUSCULAR | Status: DC

## 2023-07-20 MED ORDER — WITCH HAZEL-GLYCERIN EX PADS: 1.0000 | MEDICATED_PAD | CUTANEOUS | Status: DC | PRN

## 2023-07-20 MED ORDER — PRENATAL MULTIVITAMIN CH
1.0000 | ORAL_TABLET | Freq: Every day | ORAL | Status: DC
  Administered 2023-07-21: 1 via ORAL
  Filled 2023-07-20: qty 1

## 2023-07-20 MED ORDER — FENTANYL CITRATE (PF) 100 MCG/2ML IJ SOLN
INTRAMUSCULAR | Status: DC | PRN
  Administered 2023-07-20: 15 ug via INTRAVENOUS

## 2023-07-20 MED ORDER — VITAMIN D 25 MCG (1000 UNIT) PO TABS
4000.0000 [IU] | ORAL_TABLET | Freq: Every day | ORAL | Status: DC
  Administered 2023-07-20 – 2023-07-21 (×2): 4000 [IU] via ORAL
  Filled 2023-07-20 (×5): qty 4

## 2023-07-20 MED ORDER — ZOLPIDEM TARTRATE 5 MG PO TABS: 5.0000 mg | ORAL_TABLET | Freq: Every evening | ORAL | Status: DC | PRN

## 2023-07-20 MED ORDER — MORPHINE SULFATE (PF) 0.5 MG/ML IJ SOLN
INTRAMUSCULAR | Status: DC | PRN
  Administered 2023-07-20: 150 ug via EPIDURAL

## 2023-07-20 MED ORDER — BUPIVACAINE IN DEXTROSE 0.75-8.25 % IT SOLN
INTRATHECAL | Status: DC | PRN
  Administered 2023-07-20: 1.6 mL via INTRATHECAL

## 2023-07-20 MED ORDER — VITAMIN D3 50 MCG (2000 UT) PO TABS: 4000.0000 [IU] | ORAL_TABLET | Freq: Every morning | ORAL | Status: DC

## 2023-07-20 MED ORDER — SOD CITRATE-CITRIC ACID 500-334 MG/5ML PO SOLN
30.0000 mL | ORAL | Status: AC
  Administered 2023-07-20: 30 mL via ORAL

## 2023-07-20 MED ORDER — LACTATED RINGERS IV SOLN: INTRAVENOUS | Status: DC

## 2023-07-20 MED ORDER — DIPHENHYDRAMINE HCL 25 MG PO CAPS: 25.0000 mg | ORAL_CAPSULE | Freq: Four times a day (QID) | ORAL | Status: DC | PRN

## 2023-07-20 MED ORDER — SOD CITRATE-CITRIC ACID 500-334 MG/5ML PO SOLN
ORAL | Status: AC
  Filled 2023-07-20: qty 30

## 2023-07-20 MED ORDER — CEFAZOLIN SODIUM-DEXTROSE 2-4 GM/100ML-% IV SOLN
2.0000 g | INTRAVENOUS | Status: AC
  Administered 2023-07-20: 2 g via INTRAVENOUS

## 2023-07-20 MED ORDER — FENTANYL CITRATE (PF) 100 MCG/2ML IJ SOLN
INTRAMUSCULAR | Status: AC
  Filled 2023-07-20: qty 2

## 2023-07-20 MED ORDER — ACETAMINOPHEN 160 MG/5ML PO SOLN: 1000.0000 mg | Freq: Once | ORAL | Status: DC | PRN

## 2023-07-20 MED ORDER — OXYTOCIN-SODIUM CHLORIDE 30-0.9 UT/500ML-% IV SOLN
2.5000 [IU]/h | INTRAVENOUS | Status: AC
  Administered 2023-07-20: 2.5 [IU]/h via INTRAVENOUS
  Filled 2023-07-20: qty 500

## 2023-07-20 MED ORDER — MENTHOL 3 MG MT LOZG: 1.0000 | LOZENGE | OROMUCOSAL | Status: DC | PRN

## 2023-07-20 MED ORDER — DIBUCAINE (PERIANAL) 1 % EX OINT: 1.0000 | TOPICAL_OINTMENT | CUTANEOUS | Status: DC | PRN

## 2023-07-20 MED ORDER — FENTANYL CITRATE (PF) 100 MCG/2ML IJ SOLN: 25.0000 ug | INTRAMUSCULAR | Status: DC | PRN

## 2023-07-20 MED ORDER — ACETAMINOPHEN 500 MG PO TABS: 1000.0000 mg | ORAL_TABLET | Freq: Once | ORAL | Status: DC | PRN

## 2023-07-20 MED ORDER — POVIDONE-IODINE 10 % EX SOLN: CUTANEOUS | Status: DC | PRN

## 2023-07-20 MED ORDER — IBUPROFEN 600 MG PO TABS
600.0000 mg | ORAL_TABLET | Freq: Four times a day (QID) | ORAL | Status: DC | PRN
  Administered 2023-07-20 – 2023-07-21 (×4): 600 mg via ORAL
  Filled 2023-07-20 (×4): qty 1

## 2023-07-20 MED ORDER — SIMETHICONE 80 MG PO CHEW
80.0000 mg | CHEWABLE_TABLET | Freq: Three times a day (TID) | ORAL | Status: DC
  Administered 2023-07-20 – 2023-07-22 (×5): 80 mg via ORAL
  Filled 2023-07-20 (×5): qty 1

## 2023-07-20 MED ORDER — DEXAMETHASONE SODIUM PHOSPHATE 10 MG/ML IJ SOLN
INTRAMUSCULAR | Status: DC | PRN
  Administered 2023-07-20: 10 mg via INTRAVENOUS

## 2023-07-20 MED ORDER — SENNOSIDES-DOCUSATE SODIUM 8.6-50 MG PO TABS
2.0000 | ORAL_TABLET | ORAL | Status: DC
  Administered 2023-07-20 – 2023-07-21 (×2): 2 via ORAL
  Filled 2023-07-20 (×2): qty 2

## 2023-07-20 MED ORDER — OXYTOCIN-SODIUM CHLORIDE 30-0.9 UT/500ML-% IV SOLN
INTRAVENOUS | Status: DC | PRN
  Administered 2023-07-20: 300 mL via INTRAVENOUS

## 2023-07-20 MED ORDER — ACETAMINOPHEN 10 MG/ML IV SOLN
1000.0000 mg | Freq: Once | INTRAVENOUS | Status: DC | PRN
  Administered 2023-07-20: 1000 mg via INTRAVENOUS

## 2023-07-20 MED ORDER — ONDANSETRON HCL 4 MG/2ML IJ SOLN
INTRAMUSCULAR | Status: DC | PRN
  Administered 2023-07-20: 4 mg via INTRAVENOUS

## 2023-07-20 MED ORDER — ONDANSETRON HCL 4 MG/2ML IJ SOLN
INTRAMUSCULAR | Status: AC
Start: 2023-07-20 — End: ?
  Filled 2023-07-20: qty 2

## 2023-07-20 MED ORDER — PHENYLEPHRINE HCL-NACL 20-0.9 MG/250ML-% IV SOLN
INTRAVENOUS | Status: DC | PRN
  Administered 2023-07-20: 60 ug/min via INTRAVENOUS

## 2023-07-20 MED ORDER — PHENYLEPHRINE 80 MCG/ML (10ML) SYRINGE FOR IV PUSH (FOR BLOOD PRESSURE SUPPORT)
PREFILLED_SYRINGE | INTRAVENOUS | Status: DC | PRN
  Administered 2023-07-20 (×2): 160 ug via INTRAVENOUS

## 2023-07-20 MED ORDER — SIMETHICONE 80 MG PO CHEW: 80.0000 mg | CHEWABLE_TABLET | ORAL | Status: DC | PRN

## 2023-07-20 MED ORDER — OXYCODONE HCL 5 MG PO TABS
5.0000 mg | ORAL_TABLET | ORAL | Status: DC | PRN
  Administered 2023-07-21: 5 mg via ORAL
  Filled 2023-07-20: qty 1

## 2023-07-20 MED ORDER — MORPHINE SULFATE (PF) 0.5 MG/ML IJ SOLN
INTRAMUSCULAR | Status: AC
  Filled 2023-07-20: qty 10

## 2023-07-20 MED ORDER — COCONUT OIL OIL: 1.0000 | TOPICAL_OIL | Status: DC | PRN

## 2023-07-20 MED ORDER — ENOXAPARIN SODIUM 60 MG/0.6ML IJ SOSY
0.5000 mg/kg | PREFILLED_SYRINGE | INTRAMUSCULAR | Status: DC
Start: 2023-07-21 — End: 2023-07-22
  Administered 2023-07-21 – 2023-07-22 (×2): 47.5 mg via SUBCUTANEOUS
  Filled 2023-07-20 (×2): qty 0.6

## 2023-07-20 MED ORDER — CEFAZOLIN SODIUM-DEXTROSE 2-4 GM/100ML-% IV SOLN
INTRAVENOUS | Status: AC
  Filled 2023-07-20: qty 100

## 2023-07-20 SURGICAL SUPPLY — 31 items
BENZOIN TINCTURE PRP APPL 2/3 (GAUZE/BANDAGES/DRESSINGS) ×1 IMPLANT
CHLORAPREP W/TINT 26 (MISCELLANEOUS) ×2 IMPLANT
CLAMP UMBILICAL CORD (MISCELLANEOUS) ×1 IMPLANT
CLOTH BEACON ORANGE TIMEOUT ST (SAFETY) ×1 IMPLANT
DRAIN JACKSON PRT FLT 10 (DRAIN) IMPLANT
DRSG OPSITE POSTOP 4X10 (GAUZE/BANDAGES/DRESSINGS) ×1 IMPLANT
ELECT REM PT RETURN 9FT ADLT (ELECTROSURGICAL) ×1
ELECTRODE REM PT RTRN 9FT ADLT (ELECTROSURGICAL) ×1 IMPLANT
EVACUATOR SILICONE 100CC (DRAIN) IMPLANT
EXTRACTOR VACUUM M CUP 4 TUBE (SUCTIONS) IMPLANT
GLOVE BIO SURGEON STRL SZ 6.5 (GLOVE) ×1 IMPLANT
GLOVE BIOGEL PI IND STRL 7.0 (GLOVE) ×2 IMPLANT
GOWN STRL REUS W/TWL LRG LVL3 (GOWN DISPOSABLE) ×2 IMPLANT
KIT ABG SYR 3ML LUER SLIP (SYRINGE) IMPLANT
NDL HYPO 25X5/8 SAFETYGLIDE (NEEDLE) IMPLANT
NEEDLE HYPO 25X5/8 SAFETYGLIDE (NEEDLE) IMPLANT
NS IRRIG 1000ML POUR BTL (IV SOLUTION) ×1 IMPLANT
PACK C SECTION WH (CUSTOM PROCEDURE TRAY) ×1 IMPLANT
PAD OB MATERNITY 4.3X12.25 (PERSONAL CARE ITEMS) ×1 IMPLANT
RTRCTR C-SECT PINK 25CM LRG (MISCELLANEOUS) IMPLANT
STRIP CLOSURE SKIN 1/2X4 (GAUZE/BANDAGES/DRESSINGS) ×1 IMPLANT
SUT CHROMIC 0 CT 1 (SUTURE) ×1 IMPLANT
SUT MNCRL AB 3-0 PS2 27 (SUTURE) ×1 IMPLANT
SUT PLAIN 2 0 XLH (SUTURE) ×1 IMPLANT
SUT PLAIN ABS 2-0 CT1 27XMFL (SUTURE) ×2 IMPLANT
SUT SILK 2 0 SH (SUTURE) IMPLANT
SUT VIC AB 0 CTX36XBRD ANBCTRL (SUTURE) ×4 IMPLANT
SUT VIC AB 2-0 SH 27XBRD (SUTURE) IMPLANT
TOWEL OR 17X24 6PK STRL BLUE (TOWEL DISPOSABLE) ×1 IMPLANT
TRAY FOLEY W/BAG SLVR 14FR LF (SET/KITS/TRAYS/PACK) ×1 IMPLANT
WATER STERILE IRR 1000ML POUR (IV SOLUTION) ×1 IMPLANT

## 2023-07-20 NOTE — Lactation Note (Signed)
This note was copied from a baby's chart. Lactation Consultation Note  Patient Name: Tanya Myers Date: 07/20/2023 Age:23 hours Reason for consult: Initial assessment  P2- MOB is formula feeding only. Please let lactation team know if she changes her mind or needs to see lactation.  Feeding Mother's Current Feeding Choice: Formula Nipple Type: Slow - flow  Consult Status Consult Status: Complete Date: 07/20/23    Dema Severin BS, IBCLC 07/20/2023, 3:11 PM

## 2023-07-20 NOTE — Anesthesia Postprocedure Evaluation (Signed)
Anesthesia Post Note  Patient: Tanya Myers  Procedure(s) Performed: REPEAT CESAREAN SECTION     Patient location during evaluation: PACU Anesthesia Type: Spinal Level of consciousness: awake and alert Pain management: pain level controlled Vital Signs Assessment: post-procedure vital signs reviewed and stable Respiratory status: spontaneous breathing, nonlabored ventilation and respiratory function stable Cardiovascular status: stable and blood pressure returned to baseline Postop Assessment: no apparent nausea or vomiting Anesthetic complications: no   No notable events documented.  Last Vitals:  Vitals:   07/20/23 1415 07/20/23 1435  BP: 107/65 (!) 104/58  Pulse: 76 75  Resp: (!) 9 18  Temp:  36.8 C  SpO2: 98% 100%    Last Pain:  Vitals:   07/20/23 1435  TempSrc: Oral  PainSc: 2    Pain Goal: Patients Stated Pain Goal: 3 (07/20/23 1435)  LLE Motor Response: Purposeful movement (07/20/23 1415) LLE Sensation: Tingling (07/20/23 1415) RLE Motor Response: Purposeful movement (07/20/23 1415) RLE Sensation: Tingling (07/20/23 1415)     Epidural/Spinal Function Cutaneous sensation: Able to Wiggle Toes (07/20/23 1435), Patient able to flex knees: Yes (07/20/23 1435), Patient able to lift hips off bed: No (07/20/23 1435), Back pain beyond tenderness at insertion site: No (07/20/23 1435), Progressively worsening motor and/or sensory loss: No (07/20/23 1435), Bowel and/or bladder incontinence post epidural: No (07/20/23 1435)  Lemar Bakos

## 2023-07-20 NOTE — Research (Signed)
Chart was open for review due to be assigned to this patient by charge nurse S.Hodges to the [facility] on 07/20/2023.

## 2023-07-20 NOTE — Anesthesia Procedure Notes (Signed)
Spinal  Patient location during procedure: OR Start time: 07/20/2023 11:35 AM End time: 07/20/2023 11:45 AM Reason for block: surgical anesthesia Staffing Performed: anesthesiologist  Anesthesiologist: Val Eagle, MD Performed by: Val Eagle, MD Authorized by: Val Eagle, MD   Preanesthetic Checklist Completed: patient identified, IV checked, risks and benefits discussed, surgical consent, monitors and equipment checked, pre-op evaluation and timeout performed Spinal Block Patient position: sitting Prep: DuraPrep Patient monitoring: heart rate, cardiac monitor, continuous pulse ox and blood pressure Approach: midline Location: L4-5 Injection technique: single-shot Needle Needle type: Pencan  Needle gauge: 24 G Needle length: 9 cm Assessment Sensory level: T6 Events: CSF return

## 2023-07-20 NOTE — Op Note (Signed)
Cesarean Section Procedure Note   Tanya Myers  07/20/2023  Indications: Scheduled Proceedure/Maternal Request   Pre-operative Diagnosis: REPEAT.   Post-operative Diagnosis: Same   Surgeon: Surgeons and Role:    * Jaymes Graff, MD - Primary   Assistants: none.  Or technician  Anesthesia: spinal   Procedure Details:  The patient was seen in the Holding Room. The risks, benefits, complications, treatment options, and expected outcomes were discussed with the patient. The patient concurred with the proposed plan, giving informed consent. identified as Tanya Myers and the procedure verified as C-Section Delivery. A Time Out was held and the above information confirmed.  After induction of anesthesia, the patient was draped and prepped in the usual sterile manner. A transverse incision was made and carried down through the subcutaneous tissue to the fascia. Fascial incision was made in the midline and extended transversely. The fascia was separated from the underlying rectus muscle superiorly and inferiorly. The peritoneum was identified and entered. Peritoneal incision was extended longitudinally with good visualization of bowel and bladder. The utero-vesical peritoneal reflection was incised transversely and the bladder flap was bluntly freed from the lower uterine segment.  An alexsis retractor was placed in the abdomen.   A low transverse uterine incision was made. Delivered from cephalic presentation was a  infant, with Apgar scores of 9 at one minute and 9 at five minutes. Cord ph was not sent the umbilical cord was clamped and cut cord blood was obtained for evaluation. The placenta was removed Intact and appeared normal. The uterine outline, tubes and ovaries appeared normal}. The uterine incision was closed with running locked sutures of 0Vicryl. A second layer 0 vicrlyl was used to imbricate the uterine incision    Hemostasis was observed. Lavage was carried out until clear. The  alexsis was removed.  The peritoneum was closed with 0 chromic.  The muscles were examined and any bleeders were made hemostatic using bovie cautery device.   The fascia was then reapproximated with running sutures of 0 vicryl.  The subcutaneous tissue was reapproximated  With interrupted stitches using 2-0 plain gut. The subcuticular closure was performed using 3-69monocryl     Instrument, sponge, and needle counts were correct prior the abdominal closure and were correct at the conclusion of the case.    Findings: infant was delivered from vtx presentation. The fluid was clear.  The uterus tubes and ovaries appeared normal.     Estimated Blood Loss: 507cc   Total IV Fluids:   Urine Output:  200CC OF clear urine  Specimens:   Complications: no complications  Disposition: PACU - hemodynamically stable.   Maternal Condition: stable   Baby condition / location:  Couplet care / Skin to Skin  Attending Attestation: I performed the procedure.   Signed: Surgeon(s): Jaymes Graff, MD

## 2023-07-20 NOTE — Anesthesia Preprocedure Evaluation (Signed)
Anesthesia Evaluation  Patient identified by MRN, date of birth, ID band Patient awake    Reviewed: Allergy & Precautions, NPO status , Patient's Chart, lab work & pertinent test results  History of Anesthesia Complications Negative for: history of anesthetic complications  Airway Mallampati: III  TM Distance: >3 FB Neck ROM: Full    Dental  (+) Dental Advisory Given, Teeth Intact   Pulmonary neg pulmonary ROS   breath sounds clear to auscultation       Cardiovascular negative cardio ROS  Rhythm:Regular     Neuro/Psych negative neurological ROS  negative psych ROS   GI/Hepatic negative GI ROS, Neg liver ROS,,,  Endo/Other  negative endocrine ROS    Renal/GU negative Renal ROS     Musculoskeletal   Abdominal   Peds  Hematology  (+) Blood dyscrasia, anemia Lab Results      Component                Value               Date                      WBC                      10.8 (H)            07/18/2023                HGB                      10.7 (L)            07/18/2023                HCT                      34.5 (L)            07/18/2023                MCV                      83.5                07/18/2023                PLT                      266                 07/18/2023              Anesthesia Other Findings   Reproductive/Obstetrics (+) Pregnancy                             Anesthesia Physical Anesthesia Plan  ASA: 2  Anesthesia Plan: Spinal   Post-op Pain Management:    Induction:   PONV Risk Score and Plan: 2 and Ondansetron  Airway Management Planned: Natural Airway, Nasal Cannula and Simple Face Mask  Additional Equipment: None  Intra-op Plan:   Post-operative Plan:   Informed Consent: I have reviewed the patients History and Physical, chart, labs and discussed the procedure including the risks, benefits and alternatives for the proposed anesthesia with the  patient or authorized representative who has indicated his/her understanding and acceptance.     Dental advisory  given  Plan Discussed with: CRNA  Anesthesia Plan Comments:        Anesthesia Quick Evaluation

## 2023-07-20 NOTE — Transfer of Care (Signed)
Immediate Anesthesia Transfer of Care Note  Patient: Tanya Myers  Procedure(s) Performed: REPEAT CESAREAN SECTION  Patient Location: PACU  Anesthesia Type:Spinal  Level of Consciousness: awake  Airway & Oxygen Therapy: Patient Spontanous Breathing  Post-op Assessment: Report given to RN  Post vital signs: Reviewed and stable  Last Vitals:  Vitals Value Taken Time  BP    Temp    Pulse 77 07/20/23 1309  Resp 10 07/20/23 1309  SpO2 96 % 07/20/23 1309  Vitals shown include unfiled device data.  Last Pain:  Vitals:   07/20/23 0948  TempSrc: Oral  PainSc: 0-No pain         Complications: No notable events documented.

## 2023-07-21 ENCOUNTER — Encounter (HOSPITAL_COMMUNITY): Payer: Self-pay | Admitting: Obstetrics and Gynecology

## 2023-07-21 LAB — CBC
HCT: 32.1 % — ABNORMAL LOW (ref 36.0–46.0)
Hemoglobin: 10.3 g/dL — ABNORMAL LOW (ref 12.0–15.0)
MCH: 26.8 pg (ref 26.0–34.0)
MCHC: 32.1 g/dL (ref 30.0–36.0)
MCV: 83.6 fL (ref 80.0–100.0)
Platelets: 274 10*3/uL (ref 150–400)
RBC: 3.84 MIL/uL — ABNORMAL LOW (ref 3.87–5.11)
RDW: 18.5 % — ABNORMAL HIGH (ref 11.5–15.5)
WBC: 16.2 10*3/uL — ABNORMAL HIGH (ref 4.0–10.5)
nRBC: 0 % (ref 0.0–0.2)

## 2023-07-22 MED ORDER — ACETAMINOPHEN 325 MG PO TABS: 650.0000 mg | ORAL_TABLET | Freq: Four times a day (QID) | ORAL | 1 refills | Status: AC | PRN

## 2023-07-22 MED ORDER — IBUPROFEN 600 MG PO TABS: 600.0000 mg | ORAL_TABLET | Freq: Four times a day (QID) | ORAL | 1 refills | Status: AC | PRN

## 2023-07-22 NOTE — Progress Notes (Signed)
Subjective: Postpartum Day 1: Cesarean Delivery Patient reports tolerating PO, + flatus, and no problems voiding.    Objective: Vital signs in last 24 hours: Temp:  [97.7 F (36.5 C)-99 F (37.2 C)] 99 F (37.2 C) (12/30 2026) Pulse Rate:  [59-68] 68 (12/30 2026) Resp:  [18] 18 (12/30 2026) BP: (101-106)/(56-68) 106/57 (12/30 2026) SpO2:  [97 %-100 %] 100 % (12/30 2026)  Physical Exam:  General: alert, cooperative, and no distress Lochia: appropriate Uterine Fundus: firm Incision: With small dried blood on right edge of dressing.  DVT Evaluation: No evidence of DVT seen on physical exam. No significant calf/ankle edema.  Recent Labs    07/20/23 1552 07/21/23 0438  HGB 11.1* 10.3*  HCT 35.0* 32.1*    Assessment/Plan: 22 y/o G2P2002 POD # 1 Status post repeat Cesarean section. Doing well postoperatively.  Continue current care. Change dressing.   Prescilla Sours, MD 07/21/2023, 10:00 AM

## 2023-07-22 NOTE — Discharge Summary (Signed)
 Postpartum Discharge Summary  Date of Service updated 07-22-23     Patient Name: Tanya Myers DOB: 2001-01-21 MRN: [id number]  Date of admission: 07/20/2023 Delivery date:07/20/2023 Delivering provider: ARMOND CAPE Date of discharge: 07/22/2023  Admitting diagnosis: History of placenta previa [Z87.59] S/P cesarean section [Z98.891] Intrauterine pregnancy: [redacted]w[redacted]d     Secondary diagnosis:  Principal Problem:   History of placenta previa Active Problems:   S/P cesarean section  Additional problems: none    Discharge diagnosis: Term Pregnancy Delivered                                              Post partum procedures: none Augmentation:  n/a Complications: None  Hospital course: Sceduled C/S   22 y.o. yo G2P2002 at [redacted]w[redacted]d was admitted to the hospital 07/20/2023 for scheduled cesarean section with the following indication:Elective Repeat.Delivery details are as follows:  Membrane Rupture Time/Date: 12:10 PM,07/20/2023  Delivery Method:C-Section, Low Transverse Operative Delivery:N/A Details of operation can be found in separate operative note.  Patient had a postpartum course complicated by nothing.  She is ambulating, tolerating a regular diet, passing flatus, and urinating well. Patient is discharged home in stable condition on  07/22/23        Newborn Data: Birth date:07/20/2023 Birth time:12:11 PM Gender:Female Living status:Living Apgars:9 ,9  Weight:3130 g     Immunizations administered: There is no immunization history for the selected administration types on file for this patient.  Physical exam  Vitals:   07/21/23 0545 07/21/23 1300 07/21/23 2026 07/22/23 0642  BP: (!) 101/56 104/68 (!) 106/57 102/74  Pulse: (!) 59 68 68 78  Resp: 18 18 18 18   Temp: 97.8 F (36.6 C) 97.7 F (36.5 C) 99 F (37.2 C) 98.5 F (36.9 C)  TempSrc: Oral Oral Oral Oral  SpO2: 99% 100% 100% 99%  Weight:      Height:       General: alert, cooperative, and no  distress Lochia: appropriate Uterine Fundus: firm Incision: dressing mildly stained (asked for it to be replaced prior to discharge) DVT Evaluation: No evidence of DVT seen on physical exam. Labs: Lab Results  Component Value Date   WBC 16.2 (H) 07/21/2023   HGB 10.3 (L) 07/21/2023   HCT 32.1 (L) 07/21/2023   MCV 83.6 07/21/2023   PLT 274 07/21/2023      Latest Ref Rng & Units 07/20/2023    3:52 PM  CMP  Creatinine 0.44 - 1.00 mg/dL 9.49    Edinburgh Score:    07/20/2023    3:55 PM  Edinburgh Postnatal Depression Scale Screening Tool  I have been able to laugh and see the funny side of things. 0  I have looked forward with enjoyment to things. 0  I have blamed myself unnecessarily when things went wrong. 0  I have been anxious or worried for no good reason. 0  I have felt scared or panicky for no good reason. 0  Things have been getting on top of me. 0  I have been so unhappy that I have had difficulty sleeping. 0  I have felt sad or miserable. 0  I have been so unhappy that I have been crying. 0  The thought of harming myself has occurred to me. 0  Edinburgh Postnatal Depression Scale Total 0      After visit meds:  Allergies as  of 07/22/2023   No Known Allergies      Medication List     STOP taking these medications    ferrous sulfate  325 (65 FE) MG tablet       TAKE these medications    acetaminophen  325 MG tablet Commonly known as: TYLENOL  Take 2 tablets (650 mg total) by mouth every 6 (six) hours as needed for mild pain (pain score 1-3) or moderate pain (pain score 4-6) (temperature > 101.5.).   ibuprofen  600 MG tablet Commonly known as: ADVIL  Take 1 tablet (600 mg total) by mouth every 6 (six) hours as needed for mild pain (pain score 1-3), moderate pain (pain score 4-6) or cramping.   Vitafol Gummies 3.33-0.333-34.8 MG Chew Chew 1 tablet by mouth daily.   Vitamin D3 50 MCG (2000 UT) Tabs Take 4,000 Units by mouth in the morning.          Discharge home in stable condition Infant Feeding: Breast Infant Disposition:home with mother Discharge instruction: per After Visit Summary and Postpartum booklet. Activity: Advance as tolerated. Pelvic rest for 6 weeks.  Diet: routine diet Anticipated Birth Control: Unsure Postpartum Appointment:6 weeks Future Appointments:No future appointments. Follow up Visit:  Follow-up Information     [facility]. Schedule an appointment as soon as possible for a visit in 6 week(s).   Specialty: Obstetrics and Gynecology Why: For Postpartum follow-up Contact information: [address] [place]  [address] [phone]                    07/22/2023 Jon CINDERELLA Rummer, MD

## 2023-07-28 ENCOUNTER — Telehealth (HOSPITAL_COMMUNITY): Payer: Self-pay

## 2023-07-28 NOTE — Telephone Encounter (Signed)
 07/28/2023 1152  Name: Tanya Myers MRN: [id number] DOB: 2001/01/07  Reason for Call:  Transition of Care Hospital Discharge Call  Contact Status: Patient Contact Status: Complete  Language assistant needed:          Follow-Up Questions: Do You Have Any Concerns About Your Health As You Heal From Delivery?: No Do You Have Any Concerns About Your Infants Health?: No  Edinburgh Postnatal Depression Scale:  In the Past 7 Days: I have been able to laugh and see the funny side of things.: As much as I always could I have looked forward with enjoyment to things.: As much as I ever did I have blamed myself unnecessarily when things went wrong.: No, never I have been anxious or worried for no good reason.: No, not at all I have felt scared or panicky for no good reason.: No, not at all Things have been getting on top of me.: No, I have been coping as well as ever I have been so unhappy that I have had difficulty sleeping.: Not at all I have felt sad or miserable.: No, not at all I have been so unhappy that I have been crying.: No, never The thought of harming myself has occurred to me.: Never [place] Postnatal Depression Scale Total: 0  PHQ2-9 Depression Scale:     Discharge Follow-up: Edinburgh score requires follow up?: No Patient was advised of the following resources:: Breastfeeding Support Group, Support Group  Post-discharge interventions: Reviewed Newborn Safe Sleep Practices  Signature  Rosaline Deretha PEAK

## 2023-09-01 DIAGNOSIS — Z308 Encounter for other contraceptive management: Secondary | ICD-10-CM | POA: Diagnosis not present
